# Patient Record
Sex: Female | Born: 1980 | Race: White | Hispanic: No | Smoking: Current every day smoker
Health system: Southern US, Community
[De-identification: ages and names within clinical notes are randomized; demographics above are authoritative.]

## PROBLEM LIST (undated history)

## (undated) DIAGNOSIS — L409 Psoriasis, unspecified: Secondary | ICD-10-CM

## (undated) DIAGNOSIS — F458 Other somatoform disorders: Secondary | ICD-10-CM

## (undated) DIAGNOSIS — M2669 Other specified disorders of temporomandibular joint: Secondary | ICD-10-CM

## (undated) DIAGNOSIS — F419 Anxiety disorder, unspecified: Secondary | ICD-10-CM

## (undated) DIAGNOSIS — R203 Hyperesthesia: Secondary | ICD-10-CM

## (undated) DIAGNOSIS — S63599A Other specified sprain of unspecified wrist, initial encounter: Secondary | ICD-10-CM

## (undated) HISTORY — PX: FOREARM FRACTURE SURGERY: SHX649

## (undated) HISTORY — PX: WISDOM TOOTH EXTRACTION: SHX21

---

## 1998-10-23 ENCOUNTER — Inpatient Hospital Stay (HOSPITAL_COMMUNITY): Admission: AD | Admit: 1998-10-23 | Discharge: 1998-10-23 | Payer: Self-pay | Admitting: *Deleted

## 1999-04-03 ENCOUNTER — Inpatient Hospital Stay (HOSPITAL_COMMUNITY): Admission: AD | Admit: 1999-04-03 | Discharge: 1999-04-03 | Payer: Self-pay | Admitting: Obstetrics & Gynecology

## 1999-06-24 ENCOUNTER — Other Ambulatory Visit: Admission: RE | Admit: 1999-06-24 | Discharge: 1999-06-24 | Payer: Self-pay | Admitting: Obstetrics

## 1999-06-29 ENCOUNTER — Inpatient Hospital Stay (HOSPITAL_COMMUNITY): Admission: AD | Admit: 1999-06-29 | Discharge: 1999-06-29 | Payer: Self-pay | Admitting: *Deleted

## 1999-06-29 ENCOUNTER — Encounter: Payer: Self-pay | Admitting: *Deleted

## 1999-07-25 ENCOUNTER — Observation Stay (HOSPITAL_COMMUNITY): Admission: AD | Admit: 1999-07-25 | Discharge: 1999-07-26 | Payer: Self-pay | Admitting: *Deleted

## 1999-07-25 ENCOUNTER — Encounter: Payer: Self-pay | Admitting: Obstetrics & Gynecology

## 1999-08-06 ENCOUNTER — Inpatient Hospital Stay (HOSPITAL_COMMUNITY): Admission: AD | Admit: 1999-08-06 | Discharge: 1999-08-06 | Payer: Self-pay | Admitting: *Deleted

## 1999-08-08 ENCOUNTER — Inpatient Hospital Stay (HOSPITAL_COMMUNITY): Admission: AD | Admit: 1999-08-08 | Discharge: 1999-08-08 | Payer: Self-pay | Admitting: Obstetrics & Gynecology

## 1999-09-14 ENCOUNTER — Inpatient Hospital Stay (HOSPITAL_COMMUNITY): Admission: AD | Admit: 1999-09-14 | Discharge: 1999-09-14 | Payer: Self-pay | Admitting: Obstetrics & Gynecology

## 1999-09-27 ENCOUNTER — Inpatient Hospital Stay (HOSPITAL_COMMUNITY): Admission: AD | Admit: 1999-09-27 | Discharge: 1999-09-27 | Payer: Self-pay | Admitting: Obstetrics

## 1999-09-28 ENCOUNTER — Inpatient Hospital Stay (HOSPITAL_COMMUNITY): Admission: AD | Admit: 1999-09-28 | Discharge: 1999-09-28 | Payer: Self-pay | Admitting: *Deleted

## 1999-10-05 ENCOUNTER — Inpatient Hospital Stay (HOSPITAL_COMMUNITY): Admission: AD | Admit: 1999-10-05 | Discharge: 1999-10-05 | Payer: Self-pay | Admitting: *Deleted

## 1999-11-15 ENCOUNTER — Inpatient Hospital Stay (HOSPITAL_COMMUNITY): Admission: AD | Admit: 1999-11-15 | Discharge: 1999-11-17 | Payer: Self-pay | Admitting: Obstetrics

## 2000-02-07 ENCOUNTER — Encounter (INDEPENDENT_AMBULATORY_CARE_PROVIDER_SITE_OTHER): Payer: Self-pay

## 2000-02-07 ENCOUNTER — Other Ambulatory Visit: Admission: RE | Admit: 2000-02-07 | Discharge: 2000-02-07 | Payer: Self-pay | Admitting: Obstetrics and Gynecology

## 2000-02-17 ENCOUNTER — Encounter (INDEPENDENT_AMBULATORY_CARE_PROVIDER_SITE_OTHER): Payer: Self-pay

## 2000-02-17 ENCOUNTER — Other Ambulatory Visit: Admission: RE | Admit: 2000-02-17 | Discharge: 2000-02-17 | Payer: Self-pay | Admitting: Obstetrics and Gynecology

## 2000-10-23 ENCOUNTER — Inpatient Hospital Stay (HOSPITAL_COMMUNITY): Admission: AD | Admit: 2000-10-23 | Discharge: 2000-10-23 | Payer: Self-pay | Admitting: Obstetrics and Gynecology

## 2000-10-23 ENCOUNTER — Encounter: Payer: Self-pay | Admitting: Obstetrics and Gynecology

## 2000-11-19 ENCOUNTER — Ambulatory Visit (HOSPITAL_COMMUNITY): Admission: RE | Admit: 2000-11-19 | Discharge: 2000-11-19 | Payer: Self-pay | Admitting: Obstetrics and Gynecology

## 2000-11-19 ENCOUNTER — Encounter (INDEPENDENT_AMBULATORY_CARE_PROVIDER_SITE_OTHER): Payer: Self-pay

## 2000-11-19 HISTORY — PX: CYST EXCISION: SHX5701

## 2001-02-12 ENCOUNTER — Emergency Department (HOSPITAL_COMMUNITY): Admission: EM | Admit: 2001-02-12 | Discharge: 2001-02-13 | Payer: Self-pay | Admitting: Internal Medicine

## 2001-08-19 ENCOUNTER — Emergency Department (HOSPITAL_COMMUNITY): Admission: EM | Admit: 2001-08-19 | Discharge: 2001-08-20 | Payer: Self-pay | Admitting: Emergency Medicine

## 2001-08-19 ENCOUNTER — Encounter: Payer: Self-pay | Admitting: Emergency Medicine

## 2002-04-28 ENCOUNTER — Encounter: Payer: Self-pay | Admitting: Emergency Medicine

## 2002-04-28 ENCOUNTER — Inpatient Hospital Stay (HOSPITAL_COMMUNITY): Admission: EM | Admit: 2002-04-28 | Discharge: 2002-05-01 | Payer: Self-pay | Admitting: Obstetrics and Gynecology

## 2002-07-22 ENCOUNTER — Emergency Department (HOSPITAL_COMMUNITY): Admission: EM | Admit: 2002-07-22 | Discharge: 2002-07-22 | Payer: Self-pay

## 2002-07-24 ENCOUNTER — Emergency Department (HOSPITAL_COMMUNITY): Admission: EM | Admit: 2002-07-24 | Discharge: 2002-07-24 | Payer: Self-pay | Admitting: *Deleted

## 2002-07-26 ENCOUNTER — Encounter: Payer: Self-pay | Admitting: Emergency Medicine

## 2002-07-26 ENCOUNTER — Emergency Department (HOSPITAL_COMMUNITY): Admission: EM | Admit: 2002-07-26 | Discharge: 2002-07-27 | Payer: Self-pay | Admitting: Emergency Medicine

## 2002-08-05 ENCOUNTER — Encounter: Payer: Self-pay | Admitting: Emergency Medicine

## 2002-08-05 ENCOUNTER — Emergency Department (HOSPITAL_COMMUNITY): Admission: EM | Admit: 2002-08-05 | Discharge: 2002-08-05 | Payer: Self-pay | Admitting: Emergency Medicine

## 2002-08-31 ENCOUNTER — Emergency Department (HOSPITAL_COMMUNITY): Admission: EM | Admit: 2002-08-31 | Discharge: 2002-08-31 | Payer: Self-pay | Admitting: Emergency Medicine

## 2002-08-31 ENCOUNTER — Encounter: Payer: Self-pay | Admitting: Emergency Medicine

## 2002-09-02 ENCOUNTER — Emergency Department (HOSPITAL_COMMUNITY): Admission: EM | Admit: 2002-09-02 | Discharge: 2002-09-02 | Payer: Self-pay | Admitting: Emergency Medicine

## 2002-12-18 ENCOUNTER — Emergency Department (HOSPITAL_COMMUNITY): Admission: EM | Admit: 2002-12-18 | Discharge: 2002-12-18 | Payer: Self-pay | Admitting: Emergency Medicine

## 2002-12-21 ENCOUNTER — Emergency Department (HOSPITAL_COMMUNITY): Admission: EM | Admit: 2002-12-21 | Discharge: 2002-12-21 | Payer: Self-pay | Admitting: Emergency Medicine

## 2002-12-21 ENCOUNTER — Encounter: Payer: Self-pay | Admitting: Emergency Medicine

## 2003-01-17 ENCOUNTER — Emergency Department (HOSPITAL_COMMUNITY): Admission: EM | Admit: 2003-01-17 | Discharge: 2003-01-17 | Payer: Self-pay | Admitting: Emergency Medicine

## 2003-07-11 ENCOUNTER — Emergency Department (HOSPITAL_COMMUNITY): Admission: EM | Admit: 2003-07-11 | Discharge: 2003-07-11 | Payer: Self-pay | Admitting: *Deleted

## 2003-07-19 ENCOUNTER — Inpatient Hospital Stay (HOSPITAL_COMMUNITY): Admission: AD | Admit: 2003-07-19 | Discharge: 2003-07-19 | Payer: Self-pay | Admitting: Obstetrics and Gynecology

## 2003-08-04 ENCOUNTER — Emergency Department (HOSPITAL_COMMUNITY): Admission: EM | Admit: 2003-08-04 | Discharge: 2003-08-04 | Payer: Self-pay | Admitting: Emergency Medicine

## 2003-09-19 ENCOUNTER — Emergency Department (HOSPITAL_COMMUNITY): Admission: EM | Admit: 2003-09-19 | Discharge: 2003-09-19 | Payer: Self-pay | Admitting: Emergency Medicine

## 2004-11-05 ENCOUNTER — Ambulatory Visit: Payer: Self-pay | Admitting: Obstetrics and Gynecology

## 2004-11-06 ENCOUNTER — Ambulatory Visit (HOSPITAL_COMMUNITY): Admission: RE | Admit: 2004-11-06 | Discharge: 2004-11-06 | Payer: Self-pay | Admitting: *Deleted

## 2004-11-14 ENCOUNTER — Ambulatory Visit (HOSPITAL_COMMUNITY): Admission: RE | Admit: 2004-11-14 | Discharge: 2004-11-14 | Payer: Self-pay | Admitting: *Deleted

## 2005-01-07 ENCOUNTER — Inpatient Hospital Stay (HOSPITAL_COMMUNITY): Admission: AD | Admit: 2005-01-07 | Discharge: 2005-01-07 | Payer: Self-pay | Admitting: *Deleted

## 2005-03-21 ENCOUNTER — Emergency Department (HOSPITAL_COMMUNITY): Admission: EM | Admit: 2005-03-21 | Discharge: 2005-03-21 | Payer: Self-pay | Admitting: Emergency Medicine

## 2006-02-27 ENCOUNTER — Emergency Department (HOSPITAL_COMMUNITY): Admission: EM | Admit: 2006-02-27 | Discharge: 2006-02-28 | Payer: Self-pay | Admitting: Emergency Medicine

## 2006-04-28 ENCOUNTER — Emergency Department (HOSPITAL_COMMUNITY): Admission: EM | Admit: 2006-04-28 | Discharge: 2006-04-28 | Payer: Self-pay | Admitting: *Deleted

## 2006-11-14 ENCOUNTER — Emergency Department (HOSPITAL_COMMUNITY): Admission: EM | Admit: 2006-11-14 | Discharge: 2006-11-15 | Payer: Self-pay | Admitting: Emergency Medicine

## 2006-12-18 ENCOUNTER — Emergency Department (HOSPITAL_COMMUNITY): Admission: EM | Admit: 2006-12-18 | Discharge: 2006-12-18 | Payer: Self-pay | Admitting: Emergency Medicine

## 2007-01-05 ENCOUNTER — Emergency Department (HOSPITAL_COMMUNITY): Admission: EM | Admit: 2007-01-05 | Discharge: 2007-01-05 | Payer: Self-pay | Admitting: Emergency Medicine

## 2007-08-30 ENCOUNTER — Emergency Department (HOSPITAL_COMMUNITY): Admission: EM | Admit: 2007-08-30 | Discharge: 2007-08-30 | Payer: Self-pay | Admitting: Emergency Medicine

## 2007-09-06 ENCOUNTER — Emergency Department (HOSPITAL_COMMUNITY): Admission: EM | Admit: 2007-09-06 | Discharge: 2007-09-06 | Payer: Self-pay | Admitting: Emergency Medicine

## 2007-09-28 ENCOUNTER — Emergency Department (HOSPITAL_COMMUNITY): Admission: EM | Admit: 2007-09-28 | Discharge: 2007-09-28 | Payer: Self-pay | Admitting: Emergency Medicine

## 2007-10-23 ENCOUNTER — Emergency Department (HOSPITAL_COMMUNITY): Admission: EM | Admit: 2007-10-23 | Discharge: 2007-10-23 | Payer: Self-pay | Admitting: Emergency Medicine

## 2008-02-14 ENCOUNTER — Emergency Department (HOSPITAL_COMMUNITY): Admission: EM | Admit: 2008-02-14 | Discharge: 2008-02-14 | Payer: Self-pay | Admitting: Emergency Medicine

## 2008-06-07 ENCOUNTER — Emergency Department (HOSPITAL_COMMUNITY): Admission: EM | Admit: 2008-06-07 | Discharge: 2008-06-07 | Payer: Self-pay | Admitting: Emergency Medicine

## 2008-08-27 ENCOUNTER — Emergency Department (HOSPITAL_COMMUNITY): Admission: EM | Admit: 2008-08-27 | Discharge: 2008-08-27 | Payer: Self-pay | Admitting: Emergency Medicine

## 2008-12-17 ENCOUNTER — Emergency Department (HOSPITAL_COMMUNITY): Admission: EM | Admit: 2008-12-17 | Discharge: 2008-12-17 | Payer: Self-pay | Admitting: Emergency Medicine

## 2009-02-03 ENCOUNTER — Emergency Department (HOSPITAL_COMMUNITY): Admission: EM | Admit: 2009-02-03 | Discharge: 2009-02-03 | Payer: Self-pay | Admitting: Emergency Medicine

## 2009-07-31 ENCOUNTER — Emergency Department (HOSPITAL_COMMUNITY): Admission: EM | Admit: 2009-07-31 | Discharge: 2009-08-01 | Payer: Self-pay | Admitting: Emergency Medicine

## 2010-04-11 ENCOUNTER — Emergency Department (HOSPITAL_COMMUNITY): Admission: EM | Admit: 2010-04-11 | Discharge: 2010-04-11 | Payer: Self-pay | Admitting: Emergency Medicine

## 2010-04-12 ENCOUNTER — Emergency Department (HOSPITAL_COMMUNITY): Admission: EM | Admit: 2010-04-12 | Discharge: 2010-04-12 | Payer: Self-pay | Admitting: Emergency Medicine

## 2010-11-12 LAB — RAPID STREP SCREEN (MED CTR MEBANE ONLY): Streptococcus, Group A Screen (Direct): NEGATIVE

## 2010-12-20 NOTE — Group Therapy Note (Signed)
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  During her incarceration they discovered an ovarian cyst measuring about 10  cm in diameter. Her last ultrasound was done in December and because she was  getting out soon they held off on operation. She is still having pain in  that area but mainly radiating to the lower back. She has always had normal  Pap smear. She is on no medication on a regular basis. In addition she has  been waking up for the last couple of months with nausea, vomiting and  fever. She was seen at [facility] with a fever of 101 at one time while she  was still incarcerated (in the last month). We are going to get a CA-125,  Helicobacter pylori titer, and ultrasound of the ovary and gallbladder.   PLAN:  My plan is to remove that ovary or the cyst. We have discussed the  possibility that she could even end up with a hysterectomy if we find enough  disease within the abdomen.   PHYSICAL EXAMINATION:  ABDOMEN:  Was soft, flat, nontender, no masses, no  organomegaly, but on deep palpation in the right lower quadrant it  duplicates the back pain she is feeling and she says it feels like it is  pushing down on her cervix.  GENITALIA:  Is normal. BUS within normal limits. Vagina is clean and well  rugated.  PELVIC:  The cervix is clean and parous. Uterus is anterior, normal size,  shape and consistency.  The left ovary seems to be within normal limits. The  right ovary as aforementioned.   IMPRESSION:  1.  Complex right ovarian cyst.  2.  Recurrent nocturnal  nausea and vomiting with no signs of regurgitation.   I called the patient and scheduled surgery 1 week after the laboratory  reports and ultrasound are back.      PR/MEDQ  D:  11/05/2004  T:  11/06/2004  Job:  [id number]

## 2010-12-20 NOTE — Op Note (Signed)
[facility]  Patient:    Lindsey Martin, Lindsey Martin                    MRN: [id number] Adm. Date:  [id number] Attending:  Wandalee Ferdinand                           Operative Report  PREOPERATIVE DIAGNOSIS:       Vulvar inclusion cyst.  POSTOPERATIVE DIAGNOSIS:      Vulvar inclusion cyst, pathology pending.  PROCEDURE:                    Excision of vulvar inclusion cyst - right labium                               majus.  SURGEON:                      Rudy Jew. Ashley Royalty, M.D.  ANESTHESIA:                   IV sedation with 1% Xylocaine local followed by                               0.25% Marcaine local.  ESTIMATED BLOOD LOSS:         Less than 25 cc.  COMPLICATIONS:                None.  PACKS AND DRAINS:             None.  DESCRIPTION OF PROCEDURE:     The patient was taken to the operating room and placed in the dorsal supine position.  After adequate IV sedation was administered, she was prepped and draped in the usual manner for vulvar surgery.  The vulvar cyst was identified on the anterior aspect of the right labium majus and slightly laterally.  Approximately 5 cc of 1% Xylocaine was instilled inferior and deep to the cyst.  Using a 15 blade, the skin was incised over the cyst and the tissue surrounding the cyst were sharply and bluntly dissected in order to fully expose the cyst which was enucleated intact and submitted to pathology for histologic studies.  The dead space was closed with 3-0 chromic in an interrupted fashion.  The skin was closed with a 3-0 chromic in a subcuticular fashion.  Hemostasis was noted and the procedure terminated.  The patient tolerated the procedure extremely well and was returned to the recovery room in good condition. DD:  11/19/00 TD:  11/20/00 Job: [id number]

## 2010-12-20 NOTE — H&P (Signed)
[facility]  Patient:    Lindsey Martin, Lindsey Martin                    MRN: [id number] Adm. Date:  [id number] Attending:  Wandalee Ferdinand                         History and Physical  HISTORY OF PRESENT ILLNESS:   This is a 30 year old gravida 1, para 1 who presented complaining of a right-sided mass on the anterior aspect of her right labium majus.  It is symptomatic and causes her discomfort and she would like it removed.  MEDICATIONS:                  1. Doxycycline.                               2. Metronidazole.  PAST MEDICAL HISTORY:         Negative.  PAST SURGICAL HISTORY:        1997 LEEP vaginal delivery.  ALLERGIES:                    ZITHROMAX.  FAMILY HISTORY:               Positive for hypertension and diabetes.  SOCIAL HISTORY:               The patient drinks moderate alcohol and smokes cigarettes.  REVIEW OF SYSTEMS:            Noncontributory.  PHYSICAL EXAMINATION:  GENERAL:                      Well-developed, well-nourished, pleasant white female in no acute distress.  VITAL SIGNS:                  Afebrile.  SKIN:                         Warm and dry without lesions.  LYMPH NODES:                  There is no supraclavicular, cervical, or inguinal adenopathy.  HEENT:                        Normocephalic.  NECK:                         Supple without thyromegaly.  CHEST:                        Lungs are clear.  CARDIAC:                      Regular rate and rhythm without murmurs, gallops, or rubs.  BREASTS:                      Deferred.  ABDOMEN:                      Soft and nontender without masses or organomegaly.  Bowel sounds are active.  MUSCULOSKELETAL:              Full range of motion without edema, cyanosis, or CVA tenderness.  PELVIC:  On examination on the right aspect of the labium majus, there is an apparent inclusion cyst of approximately 0.5 cm in diameter.  It was  easily mobile.  IMPRESSION:                   Vulvar inclusion cyst--symptomatic.  PLAN:                         Removal of inclusion cyst.  Risks, benefits, ______ and alternatives were fully discussed with the patient.  She states she understands and accepts.  Questions invited and answered. DD:  11/19/00 TD:  11/19/00 Job: [id number]

## 2010-12-20 NOTE — Discharge Summary (Signed)
NAME:  Lindsey Martin, Lindsey Martin                       ACCOUNT NO.:  [id number]   MEDICAL RECORD NO.:  [id number]                   PATIENT TYPE:  INP   LOCATION:  9310                                 FACILITY:  [facility]   PHYSICIAN:  Janine Limbo, M.D.            DATE OF BIRTH:  14-Sep-1980   DATE OF ADMISSION:  04/28/2002  DATE OF DISCHARGE:  05/01/2002                                 DISCHARGE SUMMARY   DISCHARGE DIAGNOSES:  1. Pelvic inflammatory disease.  2. Cervical gonorrhea.  3. Abdominal pain.   PROCEDURES THIS ADMISSION:  None.   HISTORY OF PRESENT ILLNESS:  The patient is a 30 year old female gravida 1,  para 1-0-0-1 who presented to the [facility]  on April 28, 2002 complaining of abdominal pain.  She had nausea,  vomiting, and diarrhea.  The patient was evaluated and thought to have  pelvic inflammatory disease.  Her last menstrual period was April 20, 2002.  The patient has a history of loop electrosurgical excision procedure  because of abnormal Pap smears.  She has had one vaginal delivery at term.   ADMISSION PHYSICAL EXAMINATION:  VITAL SIGNS:  Temperature was 101.9.  GENERAL:  Her general exam was within normal limits.  PELVIC:  Significant for tender cervix and uterus.  No adnexal masses were  appreciated.   ADMISSION LABORATORY DATA:  Pregnancy test was negative.  White blood cell  count was 18,400 with 88% neutrophils.  Hemoglobin was 15.3.  Urinalysis was  negative.   ADMISSION IMAGING STUDIES:  An ultrasound was performed that showed no  pelvic masses or abnormal fluid collections.   HOSPITAL COURSE:  The patient was admitted to [facility]  for IV antibiotics.  She was given cefotetan and doxycycline.  Her  temperature slowly returned to normal.  A repeat white blood cell count on  April 29, 2002 was 17,700 and then on April 30, 2002 was 8900.  He  Chlamydia culture returned negative but her  gonorrhea culture returned  positive.  The patient slowly began to feel better.  She tolerated a regular  diet.  By May 01, 2002, the patient was felt to be ready for a  discharge.  He pelvic exam was repeated and the tenderness has completely  resolved.   DISCHARGE INSTRUCTIONS:  The patient will be given doxycycline 100 mg to  take twice each day for an additional 11 days.  She will also take Septra DS  one tablet twice each day for an additional 4 days.  She was given a  prescription for Vicodin 1-2 tablets every 4 hours as needed for pain  (dispense #30 tablets) (#1 refill).  She will measure her temperature twice  each day and will call for a temperature greater than 100.4.  She will  return to  [facility] in 4-5 days for a repeat  examination.  An HIV screen  is pending at this point and this will be  checked and then discussed with the patient when she comes in for a followup  examination.  The patient was also told that her HIV screen needed to be  repeated in 90 days.                                               Janine Limbo, M.D.    AVS/MEDQ  D:  05/01/2002  T:  05/02/2002  Job:  [id number]

## 2010-12-20 NOTE — Consult Note (Signed)
   NAME:  Lindsey Martin, Lindsey Martin                       ACCOUNT NO.:  [id number]   MEDICAL RECORD NO.:  [id number]                   PATIENT TYPE:  EMS   LOCATION:  [facility]                                 FACILITY:  [facility]   PHYSICIAN:  Abigail Miyamoto, M.D.              DATE OF BIRTH:  1981/04/06   DATE OF CONSULTATION:  01/17/2003  DATE OF DISCHARGE:                                   CONSULTATION   CHIEF COMPLAINT:  Rectal bleeding.   HISTORY OF PRESENT ILLNESS:  This is a 30 year old patient who had  participated in anal sex two days ago.  She then admitted to crack cocaine  after this.  For the past two days, she has had a profuse amount of anal  bleeding. She is currently on her period.  She denies any dizziness.  She  has had no hypotensive episodes.  She reports the bleeding has been perfuse  and this is the first time she has ever had bleeding per her anus.  She  otherwise is healthy and without complaints.   PAST MEDICAL HISTORY:  Negative.   PAST SURGICAL HISTORY:  Negative.   MEDICATIONS:  None.   ALLERGIES:  No known drug allergies.   SOCIAL HISTORY:  She works as a Copywriter, advertising.   HABITS:  She admits to smoking and to crack cocaine use.   PHYSICAL EXAMINATION:  GENERAL APPEARANCE:  A thin female in no acute  distress.  VITAL SIGNS:  She is afebrile.  Vital signs are stable.  Examination was limited to the anal area.  On outer examination, the  examination is normal.  There is some gross blood.  Anoscopy was performed  and there was blood in the anal canal but exact defect could not be  identified.  At this point, the anal canal was packed with a large piece of  Gelfoam.   LABORATORY DATA:  Hemoglobin 13.2, hematocrit 32.4.   ASSESSMENT AND PLAN:  The patient had anal trauma after anal intercourse.  At this point, the bleeding has stopped after a piece of Gelfoam had been  placed in the anal canal for over an hour.  At this point, we will forego  planned examination under  anesthesia and follow this expectantly.  If she  does resume bleeding, we will proceed to the operating room.                                               Abigail Miyamoto, M.D.    DB/MEDQ  D:  01/17/2003  T:  01/17/2003  Job:  [id number]

## 2011-01-02 ENCOUNTER — Inpatient Hospital Stay (INDEPENDENT_AMBULATORY_CARE_PROVIDER_SITE_OTHER)
Admission: RE | Admit: 2011-01-02 | Discharge: 2011-01-02 | Disposition: A | Discharge status: Home / Self Care | Payer: Self-pay | Source: Ambulatory Visit | Attending: Family Medicine | Admitting: Family Medicine

## 2011-01-02 DIAGNOSIS — M799 Soft tissue disorder, unspecified: Secondary | ICD-10-CM

## 2011-01-02 DIAGNOSIS — M542 Cervicalgia: Secondary | ICD-10-CM

## 2011-01-24 ENCOUNTER — Emergency Department (HOSPITAL_COMMUNITY): Payer: Self-pay

## 2011-01-24 ENCOUNTER — Emergency Department (HOSPITAL_COMMUNITY)
Admission: EM | Admit: 2011-01-24 | Discharge: 2011-01-24 | Disposition: A | Discharge status: Home / Self Care | Payer: Self-pay | Attending: Emergency Medicine | Admitting: Emergency Medicine

## 2011-01-24 DIAGNOSIS — M25476 Effusion, unspecified foot: Secondary | ICD-10-CM | POA: Insufficient documentation

## 2011-01-24 DIAGNOSIS — F17200 Nicotine dependence, unspecified, uncomplicated: Secondary | ICD-10-CM | POA: Insufficient documentation

## 2011-01-24 DIAGNOSIS — M25473 Effusion, unspecified ankle: Secondary | ICD-10-CM | POA: Insufficient documentation

## 2011-01-24 DIAGNOSIS — X500XXA Overexertion from strenuous movement or load, initial encounter: Secondary | ICD-10-CM | POA: Insufficient documentation

## 2011-01-24 DIAGNOSIS — S9000XA Contusion of unspecified ankle, initial encounter: Secondary | ICD-10-CM | POA: Insufficient documentation

## 2011-01-24 DIAGNOSIS — M25673 Stiffness of unspecified ankle, not elsewhere classified: Secondary | ICD-10-CM | POA: Insufficient documentation

## 2011-01-24 DIAGNOSIS — M25676 Stiffness of unspecified foot, not elsewhere classified: Secondary | ICD-10-CM | POA: Insufficient documentation

## 2011-01-24 DIAGNOSIS — M25579 Pain in unspecified ankle and joints of unspecified foot: Secondary | ICD-10-CM | POA: Insufficient documentation

## 2011-01-24 DIAGNOSIS — S93409A Sprain of unspecified ligament of unspecified ankle, initial encounter: Secondary | ICD-10-CM | POA: Insufficient documentation

## 2011-04-28 LAB — RAPID STREP SCREEN (MED CTR MEBANE ONLY): Streptococcus, Group A Screen (Direct): NEGATIVE

## 2011-05-07 LAB — URINALYSIS, ROUTINE W REFLEX MICROSCOPIC
Bilirubin Urine: NEGATIVE
Glucose, UA: NEGATIVE
Hgb urine dipstick: NEGATIVE
Ketones, ur: NEGATIVE
Nitrite: NEGATIVE
Protein, ur: NEGATIVE
Specific Gravity, Urine: 1.029
Urobilinogen, UA: 0.2
pH: 7

## 2011-05-07 LAB — PREGNANCY, URINE: Preg Test, Ur: NEGATIVE

## 2011-12-12 ENCOUNTER — Emergency Department (INDEPENDENT_AMBULATORY_CARE_PROVIDER_SITE_OTHER)
Admission: EM | Admit: 2011-12-12 | Discharge: 2011-12-12 | Disposition: A | Discharge status: Other Acute Inpatient Hospital | Payer: Self-pay | Source: Home / Self Care | Attending: Emergency Medicine | Admitting: Emergency Medicine

## 2011-12-12 ENCOUNTER — Emergency Department (HOSPITAL_COMMUNITY)
Admission: EM | Admit: 2011-12-12 | Discharge: 2011-12-12 | Disposition: A | Discharge status: Home / Self Care | Payer: Self-pay | Attending: Emergency Medicine | Admitting: Emergency Medicine

## 2011-12-12 ENCOUNTER — Encounter (HOSPITAL_COMMUNITY): Payer: Self-pay | Admitting: Emergency Medicine

## 2011-12-12 ENCOUNTER — Emergency Department (HOSPITAL_COMMUNITY): Payer: Self-pay

## 2011-12-12 ENCOUNTER — Encounter (HOSPITAL_COMMUNITY): Payer: Self-pay

## 2011-12-12 DIAGNOSIS — A499 Bacterial infection, unspecified: Secondary | ICD-10-CM | POA: Insufficient documentation

## 2011-12-12 DIAGNOSIS — R109 Unspecified abdominal pain: Secondary | ICD-10-CM

## 2011-12-12 DIAGNOSIS — N760 Acute vaginitis: Secondary | ICD-10-CM | POA: Insufficient documentation

## 2011-12-12 DIAGNOSIS — N898 Other specified noninflammatory disorders of vagina: Secondary | ICD-10-CM | POA: Insufficient documentation

## 2011-12-12 DIAGNOSIS — B9689 Other specified bacterial agents as the cause of diseases classified elsewhere: Secondary | ICD-10-CM | POA: Insufficient documentation

## 2011-12-12 DIAGNOSIS — R197 Diarrhea, unspecified: Secondary | ICD-10-CM | POA: Insufficient documentation

## 2011-12-12 LAB — URINALYSIS, ROUTINE W REFLEX MICROSCOPIC
Bilirubin Urine: NEGATIVE
Glucose, UA: NEGATIVE mg/dL
Hgb urine dipstick: NEGATIVE
Ketones, ur: NEGATIVE mg/dL
Leukocytes, UA: NEGATIVE
Nitrite: NEGATIVE
Protein, ur: NEGATIVE mg/dL
Specific Gravity, Urine: 1.012 (ref 1.005–1.030)
Urobilinogen, UA: 0.2 mg/dL (ref 0.0–1.0)
pH: 5.5 (ref 5.0–8.0)

## 2011-12-12 LAB — CBC
HCT: 44 % (ref 36.0–46.0)
Hemoglobin: 15 g/dL (ref 12.0–15.0)
MCH: 31.6 pg (ref 26.0–34.0)
MCHC: 34.1 g/dL (ref 30.0–36.0)
MCV: 92.6 fL (ref 78.0–100.0)
Platelets: 207 10*3/uL (ref 150–400)
RBC: 4.75 MIL/uL (ref 3.87–5.11)
RDW: 12.8 % (ref 11.5–15.5)
WBC: 9.6 10*3/uL (ref 4.0–10.5)

## 2011-12-12 LAB — DIFFERENTIAL
Basophils Absolute: 0 10*3/uL (ref 0.0–0.1)
Basophils Relative: 0 % (ref 0–1)
Eosinophils Absolute: 0.1 10*3/uL (ref 0.0–0.7)
Eosinophils Relative: 1 % (ref 0–5)
Lymphocytes Relative: 37 % (ref 12–46)
Lymphs Abs: 3.6 10*3/uL (ref 0.7–4.0)
Monocytes Absolute: 0.8 10*3/uL (ref 0.1–1.0)
Monocytes Relative: 8 % (ref 3–12)
Neutro Abs: 5.2 10*3/uL (ref 1.7–7.7)
Neutrophils Relative %: 54 % (ref 43–77)

## 2011-12-12 LAB — WET PREP, GENITAL
Trich, Wet Prep: NONE SEEN
Yeast Wet Prep HPF POC: NONE SEEN

## 2011-12-12 LAB — COMPREHENSIVE METABOLIC PANEL
ALT: 14 U/L (ref 0–35)
AST: 22 U/L (ref 0–37)
Albumin: 4.3 g/dL (ref 3.5–5.2)
Alkaline Phosphatase: 60 U/L (ref 39–117)
BUN: 8 mg/dL (ref 6–23)
CO2: 24 mEq/L (ref 19–32)
Calcium: 9.7 mg/dL (ref 8.4–10.5)
Chloride: 104 mEq/L (ref 96–112)
Creatinine, Ser: 0.69 mg/dL (ref 0.50–1.10)
GFR calc Af Amer: >90 mL/min (ref >90)
GFR calc non Af Amer: >90 mL/min (ref >90)
Glucose, Bld: 85 mg/dL (ref 70–99)
Potassium: 3.9 mEq/L (ref 3.5–5.1)
Sodium: 139 mEq/L (ref 135–145)
Total Bilirubin: 0.3 mg/dL (ref 0.3–1.2)
Total Protein: 7.5 g/dL (ref 6.0–8.3)

## 2011-12-12 LAB — LIPASE, BLOOD: Lipase: 95 U/L — ABNORMAL HIGH (ref 11–59)

## 2011-12-12 LAB — PREGNANCY, URINE: Preg Test, Ur: NEGATIVE

## 2011-12-12 LAB — POCT PREGNANCY, URINE: Preg Test, Ur: NEGATIVE

## 2011-12-12 MED ORDER — MORPHINE SULFATE 2 MG/ML IJ SOLN
2.0000 mg | INTRAMUSCULAR | Status: DC | PRN
  Administered 2011-12-12: 2 mg via INTRAVENOUS
  Filled 2011-12-12: qty 1

## 2011-12-12 MED ORDER — METRONIDAZOLE 500 MG PO TABS: 500.0000 mg | ORAL_TABLET | Freq: Two times a day (BID) | ORAL | Status: AC

## 2011-12-12 MED ORDER — ONDANSETRON HCL 4 MG/2ML IJ SOLN: 4.0000 mg | INTRAMUSCULAR | Status: DC | PRN

## 2011-12-12 MED ORDER — FAMOTIDINE 20 MG PO TABS: 20.0000 mg | ORAL_TABLET | Freq: Two times a day (BID) | ORAL | Status: DC

## 2011-12-12 MED ORDER — FAMOTIDINE IN NACL 20-0.9 MG/50ML-% IV SOLN
20.0000 mg | Freq: Once | INTRAVENOUS | Status: AC
  Administered 2011-12-12: 20 mg via INTRAVENOUS
  Filled 2011-12-12: qty 50

## 2011-12-12 MED ORDER — SODIUM CHLORIDE 0.9 % IV SOLN
INTRAVENOUS | Status: DC
  Administered 2011-12-12: 15:00:00 via INTRAVENOUS

## 2011-12-12 MED ORDER — IOHEXOL 300 MG/ML  SOLN
80.0000 mL | Freq: Once | INTRAMUSCULAR | Status: AC | PRN
  Administered 2011-12-12: 80 mL via INTRAVENOUS

## 2011-12-12 NOTE — ED Notes (Addendum)
C/o 2 week duration of upper abdominal pain and 2 week duration of vaginal bleeding; concern for poss ulcer, drink a lot of pepto bismol and mylanta; sexually active w no BC. States she has panic attacks at times, concerned about CA of stomach and maybe ovarian CA , since she smokes

## 2011-12-12 NOTE — ED Provider Notes (Signed)
History     CSN: [id number]  Arrival date & time 12/12/11  1231   First MD Initiated Contact with Patient 12/12/11 1311      Chief Complaint  Patient presents with  . Abdominal Pain    HPI Pt was seen at 1400.  Per pt, c/o gradual onset and persistence of constant upper abd "pain" for the past 1 to 2 years.  Pt describes the pain as being present every day, worse in the mornings.  Has been associated with diarrhea.  Has been taking Mylanta and pepto bismol without relief.  Also c/o gradual onset and persistence of constant vaginal bleeding x2 weeks.  Has been eval at [facility], sent to the ED for further eval.  Denies vaginal discharge, no CP/SOB, no back/flank pain, no N/V, no black or blood in stools or emesis, no fevers, no dysuria/hematuria.    History reviewed. No pertinent past medical history.  History reviewed. No pertinent past surgical history.   History  Substance Use Topics  . Smoking status: Current Everyday Smoker  . Smokeless tobacco: Not on file  . Alcohol Use: No    Review of Systems ROS: Statement: All systems negative except as marked or noted in the HPI; Constitutional: Negative for fever and chills. ; ; Eyes: Negative for eye pain, redness and discharge. ; ; ENMT: Negative for ear pain, hoarseness, nasal congestion, sinus pressure and sore throat. ; ; Cardiovascular: Negative for chest pain, palpitations, diaphoresis, dyspnea and peripheral edema. ; ; Respiratory: Negative for cough, wheezing and stridor. ; ; Gastrointestinal: +abd pain, diarrhea. Negative for nausea, vomiting, blood in stool, hematemesis, jaundice and rectal bleeding. . ; ; Genitourinary: Negative for dysuria, flank pain and hematuria. ;  GYN:  +vaginal bleeding, no vaginal discharge, no vulvar pain. ; ; Musculoskeletal: Negative for back pain and neck pain. Negative for swelling and trauma.; ; Skin: Negative for pruritus, rash, abrasions, blisters, bruising and skin lesion.; ; Neuro: Negative for  headache, lightheadedness and neck stiffness. Negative for weakness, altered level of consciousness , altered mental status, extremity weakness, paresthesias, involuntary movement, seizure and syncope.     Allergies  Zithromax  Home Medications   Current Outpatient Rx  Name Route Sig Dispense Refill  . MIDOL PO Oral Take 2 tablets by mouth daily as needed. As needed for menstrual cramps.      BP 114/60  Pulse 58  Temp(Src) 98.5 F (36.9 C) (Oral)  Resp 18  SpO2 100%  LMP 11/28/2011  Physical Exam 1405: Physical examination:  Nursing notes reviewed; Vital signs and O2 SAT reviewed;  Constitutional: Well developed, Well nourished, Well hydrated, In no acute distress; Head:  Normocephalic, atraumatic; Eyes: EOMI, PERRL, No scleral icterus; ENMT: Mouth and pharynx normal, Mucous membranes moist; Neck: Supple, Full range of motion, No lymphadenopathy; Cardiovascular: Regular rate and rhythm, No murmur, rub, or gallop; Respiratory: Breath sounds clear & equal bilaterally, No rales, rhonchi, wheezes, or rub, Normal respiratory effort/excursion; Chest: Nontender, Movement normal; Abdomen: Soft, +TTP RLQ, RUQ, mid-epigastric areas, no rebound, Nondistended, Normal bowel sounds; Genitourinary: No CVA tenderness; Extremities: Pulses normal, No tenderness, No edema, No calf edema or asymmetry.; Neuro: AA&Ox3, Major CN grossly intact.  No gross focal motor or sensory deficits in extremities.; Skin: Color normal, Warm, Dry   ED Course  Procedures   1410:  Pt was seen at [facility], sent to the ED for further eval.  Pt already had pelvic exam performed, with wet prep and GC/chlam are pending; pt is  deferring a repeat exam.  Labs, UA/preg, CT A/P ordered.  Will move to CDU.  Report to PA VanWingen.   MDM  MDM Reviewed: previous chart, nursing note and vitals Reviewed previous: labs Interpretation: labs and CT scan     Results for orders placed during the hospital encounter of 12/12/11  WET  PREP, GENITAL      Component Value Range   Yeast Wet Prep HPF POC NONE SEEN  NONE SEEN    Trich, Wet Prep NONE SEEN  NONE SEEN    Clue Cells Wet Prep HPF POC MODERATE (*) NONE SEEN    WBC, Wet Prep HPF POC FEW (*) NONE SEEN   CBC      Component Value Range   WBC 9.6  4.0 - 10.5 (K/uL)   RBC 4.75  3.87 - 5.11 (MIL/uL)   Hemoglobin 15.0  12.0 - 15.0 (g/dL)   HCT 40.3  47.4 - 25.9 (%)   MCV 92.6  78.0 - 100.0 (fL)   MCH 31.6  26.0 - 34.0 (pg)   MCHC 34.1  30.0 - 36.0 (g/dL)   RDW 56.3  87.5 - 64.3 (%)   Platelets 207  150 - 400 (K/uL)  DIFFERENTIAL      Component Value Range   Neutrophils Relative 54  43 - 77 (%)   Neutro Abs 5.2  1.7 - 7.7 (K/uL)   Lymphocytes Relative 37  12 - 46 (%)   Lymphs Abs 3.6  0.7 - 4.0 (K/uL)   Monocytes Relative 8  3 - 12 (%)   Monocytes Absolute 0.8  0.1 - 1.0 (K/uL)   Eosinophils Relative 1  0 - 5 (%)   Eosinophils Absolute 0.1  0.0 - 0.7 (K/uL)   Basophils Relative 0  0 - 1 (%)   Basophils Absolute 0.0  0.0 - 0.1 (K/uL)  COMPREHENSIVE METABOLIC PANEL      Component Value Range   Sodium 139  135 - 145 (mEq/L)   Potassium 3.9  3.5 - 5.1 (mEq/L)   Chloride 104  96 - 112 (mEq/L)   CO2 24  19 - 32 (mEq/L)   Glucose, Bld 85  70 - 99 (mg/dL)   BUN 8  6 - 23 (mg/dL)   Creatinine, Ser 3.29  0.50 - 1.10 (mg/dL)   Calcium 9.7  8.4 - 51.8 (mg/dL)   Total Protein 7.5  6.0 - 8.3 (g/dL)   Albumin 4.3  3.5 - 5.2 (g/dL)   AST 22  0 - 37 (U/L)   ALT 14  0 - 35 (U/L)   Alkaline Phosphatase 60  39 - 117 (U/L)   Total Bilirubin 0.3  0.3 - 1.2 (mg/dL)   GFR calc non Af Amer >90  >90 (mL/min)   GFR calc Af Amer >90  >90 (mL/min)  LIPASE, BLOOD      Component Value Range   Lipase 95 (*) 11 - 59 (U/L)  PREGNANCY, URINE      Component Value Range   Preg Test, Ur NEGATIVE  NEGATIVE   URINALYSIS, ROUTINE W REFLEX MICROSCOPIC      Component Value Range   Color, Urine YELLOW  YELLOW    APPearance CLEAR  CLEAR    Specific Gravity, Urine 1.012  1.005 - 1.030     pH 5.5  5.0 - 8.0    Glucose, UA NEGATIVE  NEGATIVE (mg/dL)   Hgb urine dipstick NEGATIVE  NEGATIVE    Bilirubin Urine NEGATIVE  NEGATIVE    Ketones, ur  NEGATIVE  NEGATIVE (mg/dL)   Protein, ur NEGATIVE  NEGATIVE (mg/dL)   Urobilinogen, UA 0.2  0.0 - 1.0 (mg/dL)   Nitrite NEGATIVE  NEGATIVE    Leukocytes, UA NEGATIVE  NEGATIVE             Laray Anger, DO 12/13/11 2132

## 2011-12-12 NOTE — Discharge Instructions (Signed)
Your studies did not show any worrisome problems. It is possible that this represents an ulcer. We will start you on a daily antacid medication. Plan to call GI if you are not improving over the next several weeks.  The tests did show a bacterial vaginosis which we will treat, although this is not likely to be the cause of your symptoms. Please take the Flagyl until gone - do not drink alcohol while taking this.  Return to the ED with concerns over worsening condition.  RESOURCE GUIDE  Dental Problems  Patients with Medicaid: [facility] [phone] [address]                                           [phone] [address] Phone:  [phone]                                                  Phone:  [phone]  If unable to pay or uninsured, contact:  [facility] or [facility]. to become qualified for the adult dental clinic.  Chronic Pain Problems Contact [facility]  [phone] Patients need to be referred by their primary care doctor.  Insufficient Money for Medicine Contact [employer]:  call "211" or [facility] [phone].  No Primary Care Doctor Call [facility]  [phone] Other agencies that provide inexpensive medical care    [facility]  [phone]    [facility]  [phone]    [facility]  [phone]    [facility]  [phone]    [facility]  [phone]    [facility]  [phone]  Psychological Services [facility] Health  [phone] [facility]  [phone] [facility] Mental [facility]   [phone] (emergency services ([phone])  Substance Abuse Resources [facility]  316-318-7285 [facility] 434-513-7164 [facility] (320)009-3496 Floydene Flock (319)512-7645 Residential & Outpatient Substance Abuse Program  613-364-8714  Abuse/Neglect [facility] Child Abuse Hotline 214-118-2585 [facility] Child Abuse Hotline 734-008-4724 (After Hours)  Emergency Shelter [facility] 9715145760  M[facility] [facility] (223)[phone] [facility] (989)[phone]  MRSA Hotline #:   229-483-5327    [facility] Resources  [facility]     [employer]                          [facility] Dept. [address] [place]                       [address]      [address]  Patrecia Pace  Michell Heinrich Phone:  [phone]                                   Phone:  [phone]                 Phone:  [phone]  [facility] Phone:  [phone]  [facility] Child Abuse Hotline 2602632385 662-633-2107 (After Hours)  Abdominal Pain Abdominal pain can be caused by many things. Your caregiver decides the seriousness of your pain by an examination and possibly blood tests and X-rays. Many cases can be observed and treated at home. Most abdominal pain is not caused by a disease and will probably improve without treatment. However, in many cases, more time must pass before a clear cause of the pain can be found. Before that point, it may not be known if you need more testing, or if hospitalization or surgery is needed. HOME CARE INSTRUCTIONS   Do not take laxatives unless directed by your caregiver.   Take pain medicine only as directed by your caregiver.   Only take over-the-counter or prescription medicines for pain, discomfort, or fever as directed by your caregiver.   Try a clear liquid diet (broth, tea, or water) for as long as directed by your caregiver. Slowly move to a bland diet as tolerated.  SEEK IMMEDIATE MEDICAL CARE IF:   The pain does not go away.   You have a fever.   You keep throwing up (vomiting).   The pain is felt only in portions of the abdomen.  Pain in the right side could possibly be appendicitis. In an adult, pain in the left lower portion of the abdomen could be colitis or diverticulitis.   You pass bloody or black tarry stools.  MAKE SURE YOU:   Understand these instructions.   Will watch your condition.   Will get help right away if you are not doing well or get worse.  Document Released: 04/30/2005 Document Revised: 07/10/2011 Document Reviewed: 03/08/2008 [facility] Patient Information 2012 [place], [place].Abdominal Pain Abdominal pain can be caused by many things. Your caregiver decides the seriousness of your pain by an examination and possibly blood tests and X-rays. Many cases can be observed and treated at home. Most abdominal pain is not caused by a disease and will probably improve without treatment. However, in many cases, more time must pass before a clear cause of the pain can be found. Before that point, it may not be known if you need more testing, or if hospitalization or surgery is needed. HOME CARE INSTRUCTIONS   Do not take laxatives unless directed by your caregiver.   Take pain medicine only as directed by your caregiver.   Only take over-the-counter or prescription medicines for pain, discomfort, or fever as directed by your caregiver.   Try a clear liquid diet (broth, tea, or water) for as long as directed by your caregiver. Slowly move to a bland diet as tolerated.  SEEK IMMEDIATE MEDICAL CARE IF:   The pain does not go away.   You have a fever.   You keep throwing up (vomiting).   The pain is felt only in portions of the abdomen. Pain in the right side could possibly be appendicitis. In an adult, pain in the left lower portion of the abdomen could be colitis or diverticulitis.   You pass bloody or black tarry stools.  MAKE SURE YOU:  Understand these instructions.   Will watch your condition.   Will get help right away if you are not doing well or get worse.  Document Released: 04/30/2005  Document Revised: 07/10/2011 Document Reviewed: 03/08/2008 [facility] Patient Information 2012 [place], [place].

## 2011-12-12 NOTE — ED Notes (Signed)
Pt sent from [facility] for eval of upper abd pain x 2 years that became worse of last several days with nausea and diarrhea x 1 week; pt sts vaginal bleeding x several weeks and had pelvic exam at [facility]

## 2011-12-12 NOTE — ED Provider Notes (Signed)
3:00 PM Care assumed of pt in CDU. Pt awaiting w/u for upper abd pain x 2 years which has worsened over past several weeks. Awaiting CT abd pel; w/u so far negative.  4:00 PM Pt's CT abd/pel negative for acute findings. Given that pain located in upper abd, and sx sound somewhat c/w GERD/ulcer, will start on Pepcid daily. Instructed to f/u with GI if not improving in next weeks with this. Mod clue cells on wet prep - will tx for BV as well. Findings and plan d/w pt. She verbalized understanding and agreed to plan. Return precautions discussed.  Results for orders placed during the hospital encounter of 12/12/11  WET PREP, GENITAL      Component Value Range   Yeast Wet Prep HPF POC NONE SEEN  NONE SEEN    Trich, Wet Prep NONE SEEN  NONE SEEN    Clue Cells Wet Prep HPF POC MODERATE (*) NONE SEEN    WBC, Wet Prep HPF POC FEW (*) NONE SEEN   CBC      Component Value Range   WBC 9.6  4.0 - 10.5 (K/uL)   RBC 4.75  3.87 - 5.11 (MIL/uL)   Hemoglobin 15.0  12.0 - 15.0 (g/dL)   HCT 16.1  09.6 - 04.5 (%)   MCV 92.6  78.0 - 100.0 (fL)   MCH 31.6  26.0 - 34.0 (pg)   MCHC 34.1  30.0 - 36.0 (g/dL)   RDW 40.9  81.1 - 91.4 (%)   Platelets 207  150 - 400 (K/uL)  DIFFERENTIAL      Component Value Range   Neutrophils Relative 54  43 - 77 (%)   Neutro Abs 5.2  1.7 - 7.7 (K/uL)   Lymphocytes Relative 37  12 - 46 (%)   Lymphs Abs 3.6  0.7 - 4.0 (K/uL)   Monocytes Relative 8  3 - 12 (%)   Monocytes Absolute 0.8  0.1 - 1.0 (K/uL)   Eosinophils Relative 1  0 - 5 (%)   Eosinophils Absolute 0.1  0.0 - 0.7 (K/uL)   Basophils Relative 0  0 - 1 (%)   Basophils Absolute 0.0  0.0 - 0.1 (K/uL)  COMPREHENSIVE METABOLIC PANEL      Component Value Range   Sodium 139  135 - 145 (mEq/L)   Potassium 3.9  3.5 - 5.1 (mEq/L)   Chloride 104  96 - 112 (mEq/L)   CO2 24  19 - 32 (mEq/L)   Glucose, Bld 85  70 - 99 (mg/dL)   BUN 8  6 - 23 (mg/dL)   Creatinine, Ser 7.82  0.50 - 1.10 (mg/dL)   Calcium 9.7  8.4 - 95.6  (mg/dL)   Total Protein 7.5  6.0 - 8.3 (g/dL)   Albumin 4.3  3.5 - 5.2 (g/dL)   AST 22  0 - 37 (U/L)   ALT 14  0 - 35 (U/L)   Alkaline Phosphatase 60  39 - 117 (U/L)   Total Bilirubin 0.3  0.3 - 1.2 (mg/dL)   GFR calc non Af Amer >90  >90 (mL/min)   GFR calc Af Amer >90  >90 (mL/min)  LIPASE, BLOOD      Component Value Range   Lipase 95 (*) 11 - 59 (U/L)  PREGNANCY, URINE      Component Value Range   Preg Test, Ur NEGATIVE  NEGATIVE   URINALYSIS, ROUTINE W REFLEX MICROSCOPIC      Component Value Range   Color, Urine YELLOW  YELLOW    APPearance CLEAR  CLEAR    Specific Gravity, Urine 1.012  1.005 - 1.030    pH 5.5  5.0 - 8.0    Glucose, UA NEGATIVE  NEGATIVE (mg/dL)   Hgb urine dipstick NEGATIVE  NEGATIVE    Bilirubin Urine NEGATIVE  NEGATIVE    Ketones, ur NEGATIVE  NEGATIVE (mg/dL)   Protein, ur NEGATIVE  NEGATIVE (mg/dL)   Urobilinogen, UA 0.2  0.0 - 1.0 (mg/dL)   Nitrite NEGATIVE  NEGATIVE    Leukocytes, UA NEGATIVE  NEGATIVE    Ct Abdomen Pelvis W Contrast  12/12/2011  *RADIOLOGY REPORT*  Clinical Data: Abdominal pain  CT ABDOMEN AND PELVIS WITH CONTRAST  Technique:  Multidetector CT imaging of the abdomen and pelvis was performed following the standard protocol during bolus administration of intravenous contrast.  Contrast: 80mL OMNIPAQUE IOHEXOL 300 MG/ML  SOLN  Comparison: None.  Findings: Liver, gallbladder, pancreas, adrenal glands, kidneys are within normal limits. Nonspecific 1 cm hypodensity in the spleen.  Small amount of free fluid is seen layering in the pelvis which may simply be physiologic.  Bladder, uterus, and ovaries are unremarkable.  Normal appendix.  Normal terminal ileum.  Retroaortic left renal vein anatomy.  IMPRESSION: No acute intra-abdominal pathology.  Original Report Authenticated By: Donavan Burnet, M.D.      Grant Fontana, [place] 12/12/11 1616

## 2011-12-12 NOTE — ED Notes (Signed)
Report received, assumed care.  

## 2011-12-12 NOTE — ED Notes (Signed)
Patient transported to CT 

## 2011-12-12 NOTE — ED Provider Notes (Signed)
History     CSN: [id number]  Arrival date & time 12/12/11  1038   First MD Initiated Contact with Patient 12/12/11 1046      Chief Complaint  Patient presents with  . Abdominal Pain    (Consider location/radiation/quality/duration/timing/severity/associated sxs/prior treatment) HPI Comments: Patient presents urgent care today complaining of an ongoing pain on her upper abdomen she points towards her epigastric area but also her left upper quadrant. "It has been going on for months he has been told that this could be an ulcer or some else" " at times this sharp and burning". Have not seen any stools for blood or vomiting blood. Also I have been bleeding from os 2-3 weeks now with pain in my lower abdomen. (Points towards both lower quadrant).  Patient denies any fevers, urinary changes, diarrheas or unintentional weight loss.  Patient is a 31 y.o. female presenting with abdominal pain. The history is provided by the patient.  Abdominal Pain The primary symptoms of the illness include abdominal pain, nausea and vaginal bleeding. The primary symptoms of the illness do not include fever, fatigue, shortness of breath, vomiting, diarrhea, hematemesis, hematochezia, dysuria or vaginal discharge. The problem has been gradually worsening.  Additional symptoms associated with the illness include anorexia. Symptoms associated with the illness do not include chills, constipation, hematuria, frequency or back pain. Significant associated medical issues do not include diabetes, liver disease, diverticulitis or HIV.    History reviewed. No pertinent past medical history.  History reviewed. No pertinent past surgical history.  History reviewed. No pertinent family history.  History  Substance Use Topics  . Smoking status: Current Everyday Smoker  . Smokeless tobacco: Not on file  . Alcohol Use: No    OB History    Grav Para Term Preterm Abortions TAB SAB Ect Mult Living                   Review of Systems  Constitutional: Positive for activity change and appetite change. Negative for fever, chills and fatigue.  Respiratory: Negative for cough and shortness of breath.   Gastrointestinal: Positive for nausea, abdominal pain and anorexia. Negative for vomiting, diarrhea, constipation, hematochezia and hematemesis.  Genitourinary: Positive for vaginal bleeding. Negative for dysuria, frequency, hematuria, vaginal discharge and vaginal pain.  Musculoskeletal: Negative for back pain.    Allergies  Zithromax  Home Medications  No current outpatient prescriptions on file.  BP 103/71  Pulse 90  Temp(Src) 98.3 F (36.8 C) (Oral)  Resp 16  SpO2 100%  LMP 11/28/2011  Physical Exam  Nursing note and vitals reviewed. Constitutional: She appears well-developed and well-nourished.  HENT:  Head: Normocephalic.  Eyes: Conjunctivae are normal.  Neck: Neck supple.  Pulmonary/Chest: Effort normal. She has no wheezes.  Abdominal: Soft. She exhibits no mass. There is tenderness. There is no guarding.  Genitourinary:    There is bleeding around the vagina. Vaginal discharge found.  Skin: No rash noted.    ED Course  Procedures (including critical care time)   Labs Reviewed  POCT PREGNANCY, URINE  WET PREP, GENITAL  GC/CHLAMYDIA PROBE AMP, GENITAL   No results found.   1. Abdominal pain       MDM  Patient presented today to urgent care complaining of severe predominantly epigastric pain for months. And also pelvic pain with predominantly tenderness in the left lower quadrant region. An ongoing vaginal bleeding. Time was somewhat concerning to the point that we've think patient needs comprehensive abdominal evaluation with several labs  perhaps imaging. Differentials include pancreatitis, active peptic ulcer disease, colitis. Patient also had ovarian cyst or image years ago that needed followup never occurred.        Jimmie Molly, MD 12/12/11 1256

## 2011-12-12 NOTE — ED Notes (Signed)
Returned from CT scan.

## 2011-12-13 LAB — GC/CHLAMYDIA PROBE AMP, GENITAL
Chlamydia, DNA Probe: NEGATIVE
GC Probe Amp, Genital: NEGATIVE

## 2011-12-13 NOTE — ED Provider Notes (Signed)
Medical screening examination/treatment/procedure(s) were conducted as a shared visit with non-physician practitioner(s) and myself.  I personally evaluated the patient during the encounter See my previous note.   Laray Anger, DO 12/13/11 2131

## 2013-01-12 ENCOUNTER — Encounter (HOSPITAL_COMMUNITY): Payer: Self-pay | Admitting: Emergency Medicine

## 2013-01-12 ENCOUNTER — Emergency Department (INDEPENDENT_AMBULATORY_CARE_PROVIDER_SITE_OTHER): Payer: Self-pay

## 2013-01-12 ENCOUNTER — Emergency Department (INDEPENDENT_AMBULATORY_CARE_PROVIDER_SITE_OTHER)
Admission: EM | Admit: 2013-01-12 | Discharge: 2013-01-12 | Disposition: A | Discharge status: Home / Self Care | Payer: Self-pay | Source: Home / Self Care | Attending: Family Medicine | Admitting: Family Medicine

## 2013-01-12 DIAGNOSIS — S0003XA Contusion of scalp, initial encounter: Secondary | ICD-10-CM

## 2013-01-12 DIAGNOSIS — S0083XA Contusion of other part of head, initial encounter: Secondary | ICD-10-CM

## 2013-01-12 MED ORDER — IBUPROFEN 600 MG PO TABS: 600.0000 mg | ORAL_TABLET | Freq: Three times a day (TID) | ORAL | Status: DC | PRN
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
## 2013-01-13 ENCOUNTER — Telehealth (HOSPITAL_COMMUNITY): Payer: Self-pay | Admitting: *Deleted

## 2013-01-13 NOTE — ED Notes (Signed)
6/11 Dr. Tressia Danas asked me to call pt. and tell her that her facial bone xray's were normal and to f/u in ED if she gets worse in anyway as noted in her discharge instructions.  I called pt. and gave her this information. Pt. voiced understanding. Vassie Moselle 01/13/2013

## 2013-06-03 ENCOUNTER — Encounter (HOSPITAL_COMMUNITY): Payer: Self-pay | Admitting: *Deleted

## 2013-06-03 ENCOUNTER — Inpatient Hospital Stay (HOSPITAL_COMMUNITY): Payer: Self-pay

## 2013-06-03 ENCOUNTER — Inpatient Hospital Stay (HOSPITAL_COMMUNITY)
Admission: AD | Admit: 2013-06-03 | Discharge: 2013-06-03 | Disposition: A | Discharge status: Home / Self Care | Payer: Self-pay | Source: Ambulatory Visit | Attending: Obstetrics & Gynecology | Admitting: Obstetrics & Gynecology

## 2013-06-03 DIAGNOSIS — B9689 Other specified bacterial agents as the cause of diseases classified elsewhere: Secondary | ICD-10-CM | POA: Insufficient documentation

## 2013-06-03 DIAGNOSIS — A499 Bacterial infection, unspecified: Secondary | ICD-10-CM | POA: Insufficient documentation

## 2013-06-03 DIAGNOSIS — R109 Unspecified abdominal pain: Secondary | ICD-10-CM

## 2013-06-03 DIAGNOSIS — R1030 Lower abdominal pain, unspecified: Secondary | ICD-10-CM

## 2013-06-03 DIAGNOSIS — N949 Unspecified condition associated with female genital organs and menstrual cycle: Secondary | ICD-10-CM | POA: Insufficient documentation

## 2013-06-03 DIAGNOSIS — N760 Acute vaginitis: Secondary | ICD-10-CM | POA: Insufficient documentation

## 2013-06-03 LAB — CBC WITH DIFFERENTIAL/PLATELET
Basophils Absolute: 0 10*3/uL (ref 0.0–0.1)
Basophils Relative: 0 % (ref 0–1)
Eosinophils Absolute: 0.1 10*3/uL (ref 0.0–0.7)
Eosinophils Relative: 1 % (ref 0–5)
HCT: 40.4 % (ref 36.0–46.0)
Hemoglobin: 13.7 g/dL (ref 12.0–15.0)
Lymphocytes Relative: 28 % (ref 12–46)
Lymphs Abs: 3.1 10*3/uL (ref 0.7–4.0)
MCH: 31.6 pg (ref 26.0–34.0)
MCHC: 33.9 g/dL (ref 30.0–36.0)
MCV: 93.1 fL (ref 78.0–100.0)
Monocytes Absolute: 0.9 10*3/uL (ref 0.1–1.0)
Monocytes Relative: 8 % (ref 3–12)
Neutro Abs: 7 10*3/uL (ref 1.7–7.7)
Neutrophils Relative %: 63 % (ref 43–77)
Platelets: 251 10*3/uL (ref 150–400)
RBC: 4.34 MIL/uL (ref 3.87–5.11)
RDW: 13 % (ref 11.5–15.5)
WBC: 11.1 10*3/uL — ABNORMAL HIGH (ref 4.0–10.5)

## 2013-06-03 LAB — WET PREP, GENITAL
Trich, Wet Prep: NONE SEEN
Yeast Wet Prep HPF POC: NONE SEEN

## 2013-06-03 LAB — URINALYSIS, ROUTINE W REFLEX MICROSCOPIC
Bilirubin Urine: NEGATIVE
Glucose, UA: NEGATIVE mg/dL
Hgb urine dipstick: NEGATIVE
Ketones, ur: NEGATIVE mg/dL
Leukocytes, UA: NEGATIVE
Nitrite: NEGATIVE
Protein, ur: NEGATIVE mg/dL
Specific Gravity, Urine: 1.025 (ref 1.005–1.030)
Urobilinogen, UA: 0.2 mg/dL (ref 0.0–1.0)
pH: 6 (ref 5.0–8.0)

## 2013-06-03 LAB — POCT PREGNANCY, URINE: Preg Test, Ur: NEGATIVE

## 2013-06-03 MED ORDER — KETOROLAC TROMETHAMINE 60 MG/2ML IM SOLN
60.0000 mg | Freq: Once | INTRAMUSCULAR | Status: AC
  Administered 2013-06-03: 60 mg via INTRAMUSCULAR
  Filled 2013-06-03: qty 2

## 2013-06-03 MED ORDER — METRONIDAZOLE 500 MG PO TABS: 500.0000 mg | ORAL_TABLET | Freq: Two times a day (BID) | ORAL | Status: DC

## 2013-06-03 NOTE — MAU Note (Signed)
About the 14th, started having bad cramps above pelvic bone. Pains come and go, at first thought it was period, it came and went and the pains continue.  Has noted an odor.   Hurts to sit, very uncomfortable.

## 2013-06-03 NOTE — MAU Provider Note (Signed)
History     CSN: [id number]  Arrival date and time: 06/03/13 1509   First Provider Initiated Contact with Patient 06/03/13 1615      Chief Complaint  Patient presents with  . Pelvic Pain   HPI Lindsey Martin is 32 y.o. G2P1011 presents with vaginal pain.  LMP 05/24/13 X 5 days--normal cycle for her.  Has not used contraception for 13 yrs without pregnancy so she feels like she cannot get pregnant.  1 partner X 10 months who had STI screening prior to intercourse--"everything was negative".  The pain began 1 week prior to period and continues.  Rates 10/10.  Feels like she is having a period without the bleeding.  Walking makes it worse, getting up and sitting down hurts.  Eating makes her bloated and then the pain starts. Last MB today but stools have been loose X 3 days.  Denies fever,nausea and vomiting. No change in appetite.  Pressure in vaginal with urination. Has not had intercourse since 10/14.  Has vaginal discharge with clumps and "it doesn't smell right".  Denies hx of HSV.  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   Past Medical History  Diagnosis Date  . Ovarian cyst   . Chlamydia     Past Surgical History  Procedure Laterality Date  . Wisdom tooth extraction      History reviewed. No pertinent family history.  History  Substance Use Topics  . Smoking status: Current Every Day Smoker -- 0.25 packs/day    Types: Cigarettes  . Smokeless tobacco: Not on file  . Alcohol Use: No    Allergies:  Allergies  Allergen Reactions  . Zithromax [Azithromycin] Hives    Prescriptions prior to admission  Medication Sig Dispense Refill  . naproxen sodium (ANAPROX) 220 MG tablet Take 220 mg by mouth as needed (cramping).        Review of Systems  Constitutional: Negative for fever and chills.  Gastrointestinal: Positive for abdominal pain. Negative for nausea, vomiting, diarrhea and constipation.  Genitourinary: Negative for dysuria, urgency,  frequency and hematuria.       Neg for vaginal bleeding Positive for vaginal discharge with clumps and unusual odor  Neurological: Positive for dizziness (with pain).   Physical Exam   Blood pressure 136/86, pulse 77, temperature 98.7 F (37.1 C), temperature source Oral, resp. rate 18, height 5\' 5"  (1.651 m), weight 125 lb (56.7 kg), last menstrual period 05/24/2013.  Physical Exam  Constitutional: She is oriented to person, place, and time. She appears well-developed and well-nourished. No distress.  HENT:  Head: Normocephalic.  Neck: Normal range of motion.  Cardiovascular: Normal rate.   Respiratory: Effort normal.  GI: Soft. She exhibits no distension and no mass. There is tenderness (suprapubic). There is no rebound and no guarding.  Genitourinary: There is no rash, tenderness or lesion on the right labia. There is no rash, tenderness or lesion on the left labia. Uterus is not enlarged and not tender. Cervix exhibits motion tenderness. Cervix exhibits no discharge and no friability. There is tenderness around the vagina. No erythema or bleeding around the vagina. Vaginal discharge (moderate amount of white creamy discharge with odor) found.  Neurological: She is alert and oriented to person, place, and time.  Skin: Skin is warm and dry.  Psychiatric: She has a normal mood and affect. Her behavior is normal.   Results for orders placed during the hospital encounter of 06/03/13 (from the past 24 hour(s))  URINALYSIS, ROUTINE W REFLEX MICROSCOPIC     Status: None   Collection Time    06/03/13  3:35 PM      Result Value Range   Color, Urine YELLOW  YELLOW   APPearance CLEAR  CLEAR   Specific Gravity, Urine 1.025  1.005 - 1.030   pH 6.0  5.0 - 8.0   Glucose, UA NEGATIVE  NEGATIVE mg/dL   Hgb urine dipstick NEGATIVE  NEGATIVE   Bilirubin Urine NEGATIVE  NEGATIVE   Ketones, ur NEGATIVE  NEGATIVE mg/dL   Protein, ur NEGATIVE  NEGATIVE mg/dL   Urobilinogen, UA 0.2  0.0 - 1.0 mg/dL    Nitrite NEGATIVE  NEGATIVE   Leukocytes, UA NEGATIVE  NEGATIVE  POCT PREGNANCY, URINE     Status: None   Collection Time    06/03/13  3:38 PM      Result Value Range   Preg Test, Ur NEGATIVE  NEGATIVE  CBC WITH DIFFERENTIAL     Status: Abnormal   Collection Time    06/03/13  4:35 PM      Result Value Range   WBC 11.1 (*) 4.0 - 10.5 K/uL   RBC 4.34  3.87 - 5.11 MIL/uL   Hemoglobin 13.7  12.0 - 15.0 g/dL   HCT 40.9  81.1 - 91.4 %   MCV 93.1  78.0 - 100.0 fL   MCH 31.6  26.0 - 34.0 pg   MCHC 33.9  30.0 - 36.0 g/dL   RDW 78.2  95.6 - 21.3 %   Platelets 251  150 - 400 K/uL   Neutrophils Relative % 63  43 - 77 %   Neutro Abs 7.0  1.7 - 7.7 K/uL   Lymphocytes Relative 28  12 - 46 %   Lymphs Abs 3.1  0.7 - 4.0 K/uL   Monocytes Relative 8  3 - 12 %   Monocytes Absolute 0.9  0.1 - 1.0 K/uL   Eosinophils Relative 1  0 - 5 %   Eosinophils Absolute 0.1  0.0 - 0.7 K/uL   Basophils Relative 0  0 - 1 %   Basophils Absolute 0.0  0.0 - 0.1 K/uL  WET PREP, GENITAL     Status: Abnormal   Collection Time    06/03/13  4:45 PM      Result Value Range   Yeast Wet Prep HPF POC NONE SEEN  NONE SEEN   Trich, Wet Prep NONE SEEN  NONE SEEN   Clue Cells Wet Prep HPF POC MANY (*) NONE SEEN   WBC, Wet Prep HPF POC RARE (*) NONE SEEN   US Transvaginal Non-ob  06/03/2013   CLINICAL DATA:  Pelvic pain for 2 weeks  EXAM: TRANSABDOMINAL AND TRANSVAGINAL ULTRASOUND OF PELVIS  TECHNIQUE: Both transabdominal and transvaginal ultrasound examinations of the pelvis were performed. Transabdominal technique was performed for global imaging of the pelvis including uterus, ovaries, adnexal regions, and pelvic cul-de-sac. It was necessary to proceed with endovaginal exam following the transabdominal exam to visualize the endometrium and ovaries.  COMPARISON:  CT 12/12/2011. Pelvic ultrasound 11/06/2004.  FINDINGS: Uterus  Measurements: 6.2 x 4.6 x 3.5 cm. Anteverted, mildly retroflexed which renders visualization of the  fundus suboptimal. No fibroids or other mass visualized.  Endometrium  Thickness: 9 mm. Borderline trilaminar in appearance without focal abnormality. No focal abnormality visualized.  Right ovary  Measurements: 4.0 x 2.9 x 1.7 cm. Normal appearance/no adnexal mass.  Left ovary  Measurements: 3.2 x 3.1 x 1.6 cm. Normal  appearance/no adnexal mass.  Other findings  No free fluid.  IMPRESSION: Normal exam.   Electronically Signed   By: Christiana Pellant M.D.   On: 06/03/2013 18:33   US Pelvis Complete  06/03/2013   CLINICAL DATA:  Pelvic pain for 2 weeks  EXAM: TRANSABDOMINAL AND TRANSVAGINAL ULTRASOUND OF PELVIS  TECHNIQUE: Both transabdominal and transvaginal ultrasound examinations of the pelvis were performed. Transabdominal technique was performed for global imaging of the pelvis including uterus, ovaries, adnexal regions, and pelvic cul-de-sac. It was necessary to proceed with endovaginal exam following the transabdominal exam to visualize the endometrium and ovaries.  COMPARISON:  CT 12/12/2011. Pelvic ultrasound 11/06/2004.  FINDINGS: Uterus  Measurements: 6.2 x 4.6 x 3.5 cm. Anteverted, mildly retroflexed which renders visualization of the fundus suboptimal. No fibroids or other mass visualized.  Endometrium  Thickness: 9 mm. Borderline trilaminar in appearance without focal abnormality. No focal abnormality visualized.  Right ovary  Measurements: 4.0 x 2.9 x 1.7 cm. Normal appearance/no adnexal mass.  Left ovary  Measurements: 3.2 x 3.1 x 1.6 cm. Normal appearance/no adnexal mass.  Other findings  No free fluid.  IMPRESSION: Normal exam.   Electronically Signed   By: Christiana Pellant M.D.   On: 06/03/2013 18:33   MAU Course  Procedures  GC/CHL culture to lab  MDM Toradol 60mg  IM given for discomfort.  Seemed to help until after ultrasound, pain is more now.  Did not offer narcotics because of hx of drug use/addiction.    Assessment and Plan  A:  Suprapubic pain      Neg for gyn cause of pain      Bacterial vaginosis  P:  Rx for Flagyl      Instructed to have pelvic rest and avoid ETOH while being treated      If sxs continue or worsen instructed to go to [facility] for evaluation--suprapubic pain with neg UA      May use additional Ibuprofen tomorrow--do not take any tonight  Roanne Haye,EVE M 06/03/2013, 6:35 PM

## 2013-06-04 LAB — GC/CHLAMYDIA PROBE AMP
CT Probe RNA: NEGATIVE
GC Probe RNA: NEGATIVE

## 2013-06-04 NOTE — MAU Provider Note (Signed)
Attestation of Attending Supervision of Advanced Practitioner (CNM/NP): Evaluation and management procedures were performed by the Advanced Practitioner under my supervision and collaboration.  I have reviewed the Advanced Practitioner's note and chart, and I agree with the management and plan.  HARRAWAY-SMITH, Luddie Boghosian 6:41 AM     

## 2013-07-07 ENCOUNTER — Emergency Department
Admission: EM | Admit: 2013-07-07 | Discharge: 2013-07-07 | Disposition: A | Discharge status: Home / Self Care | Payer: Self-pay | Source: Home / Self Care

## 2013-07-07 ENCOUNTER — Encounter: Payer: Self-pay | Admitting: Emergency Medicine

## 2013-07-07 DIAGNOSIS — J029 Acute pharyngitis, unspecified: Secondary | ICD-10-CM

## 2013-07-07 DIAGNOSIS — J069 Acute upper respiratory infection, unspecified: Secondary | ICD-10-CM

## 2013-07-07 DIAGNOSIS — Z716 Tobacco abuse counseling: Secondary | ICD-10-CM

## 2013-07-07 LAB — POCT RAPID STREP A (OFFICE): Rapid Strep A Screen: NEGATIVE

## 2013-07-07 NOTE — ED Notes (Signed)
Sore throat, ears hurt x 1 day

## 2013-07-07 NOTE — ED Provider Notes (Signed)
CSN: [id number]     Arrival date & time 07/07/13  1543 History   None    Chief Complaint  Patient presents with  . Sore Throat    HPI  URI Symptoms Onset: 1 day  Description: rhinorrhea, nasal congestion, sore throat, cough  Modifying factors:  1/4 PPD smoker; + strep exposure in sister with similar sxs.   Symptoms Nasal discharge: yes Fever: no Sore throat: yes Cough: yes Wheezing: no Ear pain: yes GI symptoms: no Sick contacts: yes; multiple family members ill with viral illnesses   Red Flags  Stiff neck: no Dyspnea: no Rash: no Swallowing difficulty: no  Sinusitis Risk Factors Headache/face pain: no Double sickening: no tooth pain: no  Allergy Risk Factors Sneezing: yes Itchy scratchy throat: no Seasonal symptoms: no  Flu Risk Factors Headache: no muscle aches: no severe fatigue: no   Past Medical History  Diagnosis Date  . Ovarian cyst   . Chlamydia    Past Surgical History  Procedure Laterality Date  . Wisdom tooth extraction     No family history on file. History  Substance Use Topics  . Smoking status: Current Every Day Smoker -- 0.25 packs/day for 20 years    Types: Cigarettes  . Smokeless tobacco: Not on file  . Alcohol Use: No   OB History   Grav Para Term Preterm Abortions TAB SAB Ect Mult Living   2 1 1  1   1  1      Review of Systems  All other systems reviewed and are negative.    Allergies  Zithromax  Home Medications   Current Outpatient Rx  Name  Route  Sig  Dispense  Refill  . metroNIDAZOLE (FLAGYL) 500 MG tablet   Oral   Take 1 tablet (500 mg total) by mouth 2 (two) times daily.   14 tablet   0   . naproxen sodium (ANAPROX) 220 MG tablet   Oral   Take 220 mg by mouth as needed (cramping).          BP 104/70  Pulse 84  Temp(Src) 98.6 F (37 C) (Oral)  Ht 5\' 6"  (1.676 m)  Wt 129 lb (58.514 kg)  BMI 20.83 kg/m2  SpO2 100%  LMP 06/24/2013 Physical Exam  Constitutional: She is oriented to person,  place, and time. She appears well-developed and well-nourished.  HENT:  Head: Normocephalic and atraumatic.  Right Ear: External ear normal.  Left Ear: External ear normal.  Eyes: Conjunctivae are normal. Pupils are equal, round, and reactive to light.  Neck: Normal range of motion. Neck supple.  Cardiovascular: Normal rate and regular rhythm.   Pulmonary/Chest: Effort normal and breath sounds normal.  Abdominal: Soft. Bowel sounds are normal.  Musculoskeletal: Normal range of motion.  Neurological: She is alert and oriented to person, place, and time.  Skin: Skin is warm.    ED Course  Procedures (including critical care time) Labs Review Labs Reviewed - No data to display Imaging Review No results found.  EKG Interpretation    Date/Time:    Ventricular Rate:    PR Interval:    QRS Duration:   QT Interval:    QTC Calculation:   R Axis:     Text Interpretation:              MDM   1. Acute pharyngitis   2. Tobacco abuse counseling   3. URI (upper respiratory infection)    Suspect likely viral illness Rapid strep negative. Will culture  Day 1 of illness Discussed supportive care and infectious red flags.  Quit smoking  Follow up as needed.     The patient and/or caregiver has been counseled thoroughly with regard to treatment plan and/or medications prescribed including dosage, schedule, interactions, rationale for use, and possible side effects and they verbalize understanding. Diagnoses and expected course of recovery discussed and will return if not improved as expected or if the condition worsens. Patient and/or caregiver verbalized understanding.         Doree Albee, MD 07/07/13 [phone]

## 2013-07-08 LAB — STREP A DNA PROBE: GASP: NEGATIVE

## 2013-07-11 ENCOUNTER — Telehealth: Payer: Self-pay | Admitting: Emergency Medicine

## 2013-07-20 ENCOUNTER — Emergency Department (INDEPENDENT_AMBULATORY_CARE_PROVIDER_SITE_OTHER): Payer: Self-pay

## 2013-07-20 ENCOUNTER — Emergency Department
Admission: EM | Admit: 2013-07-20 | Discharge: 2013-07-20 | Disposition: A | Discharge status: Home / Self Care | Payer: Self-pay | Source: Home / Self Care | Attending: Family Medicine | Admitting: Family Medicine

## 2013-07-20 ENCOUNTER — Encounter: Payer: Self-pay | Admitting: Emergency Medicine

## 2013-07-20 DIAGNOSIS — R05 Cough: Secondary | ICD-10-CM

## 2013-07-20 DIAGNOSIS — R059 Cough, unspecified: Secondary | ICD-10-CM

## 2013-07-20 DIAGNOSIS — J209 Acute bronchitis, unspecified: Secondary | ICD-10-CM

## 2013-07-20 MED ORDER — AMOXICILLIN 875 MG PO TABS: 875.0000 mg | ORAL_TABLET | Freq: Two times a day (BID) | ORAL | Status: DC

## 2013-07-20 MED ORDER — BENZONATATE 200 MG PO CAPS: 200.0000 mg | ORAL_CAPSULE | Freq: Every day | ORAL | Status: DC

## 2013-07-20 MED ORDER — HYDROCODONE-ACETAMINOPHEN 5-325 MG PO TABS: ORAL_TABLET | ORAL | Status: DC

## 2013-07-20 NOTE — ED Notes (Signed)
Pt c/o cough, chest congestion, blisters on throat, and RT ear ache x 07/06/13. She took IBF at 1000 today.

## 2013-07-20 NOTE — ED Provider Notes (Signed)
CSN: [id number]     Arrival date & time 07/20/13  1109 History   First MD Initiated Contact with Patient 07/20/13 1155     Chief Complaint  Patient presents with  . Otalgia  . Sore Throat  . Cough      HPI Comments: Since patient's previous visit about two weeks ago she has had persistent non-productive cough, sore throat, sinus congestion, and ear congestion.  She now coughs until she gags.  She has had sweats for about two days. Her Tdap is current.  She has not had a flu shot  The history is provided by the patient.    Past Medical History  Diagnosis Date  . Ovarian cyst   . Chlamydia    Past Surgical History  Procedure Laterality Date  . Wisdom tooth extraction     History reviewed. No pertinent family history. History  Substance Use Topics  . Smoking status: Current Every Day Smoker -- 0.25 packs/day for 20 years    Types: Cigarettes  . Smokeless tobacco: Not on file  . Alcohol Use: No   OB History   Grav Para Term Preterm Abortions TAB SAB Ect Mult Living   2 1 1  1   1  1      Review of Systems + sore throat + cough + pleuritic pain on right + wheezing + nasal congestion + post-nasal drainage No sinus pain/pressure No itchy/red eyes ? earache No hemoptysis + SOB No fever, + chills/sweats No nausea No vomiting No abdominal pain No diarrhea No urinary symptoms No skin rash + fatigue + myalgias No headache Used OTC meds without relief  Allergies  Zithromax  Home Medications   Current Outpatient Rx  Name  Route  Sig  Dispense  Refill  . amoxicillin (AMOXIL) 875 MG tablet   Oral   Take 1 tablet (875 mg total) by mouth 2 (two) times daily.   28 tablet   0   . benzonatate (TESSALON) 200 MG capsule   Oral   Take 1 capsule (200 mg total) by mouth at bedtime. Take as needed for cough   12 capsule   0   . HYDROcodone-acetaminophen (NORCO/VICODIN) 5-325 MG per tablet      Take one by mouth at bedtime as needed for pain   10 tablet   0    . metroNIDAZOLE (FLAGYL) 500 MG tablet   Oral   Take 1 tablet (500 mg total) by mouth 2 (two) times daily.   14 tablet   0   . naproxen sodium (ANAPROX) 220 MG tablet   Oral   Take 220 mg by mouth as needed (cramping).          BP 110/76  Pulse 98  Temp(Src) 98.7 F (37.1 C) (Oral)  Resp 16  Ht 5\' 5"  (1.651 m)  Wt 126 lb (57.153 kg)  BMI 20.97 kg/m2  SpO2 100%  LMP 06/24/2013 Physical Exam Nursing notes and Vital Signs reviewed. Appearance:  Patient appears healthy, stated age, and in no acute distress Eyes:  Pupils are equal, round, and reactive to light and accomodation.  Extraocular movement is intact.  Conjunctivae are not inflamed  Ears:  Canals normal.  Tympanic membranes normal.  Nose:  Mildly congested turbinates.  No sinus tenderness.  Pharynx:  Normal Neck:  Supple.  Slightly tender shotty posterior nodes are palpated bilaterally  Lungs:  Clear to auscultation.  Breath sounds are equal.  Chest:  Distinct tenderness to palpation over the mid-sternum, and  just to right of sternum Heart:  Regular rate and rhythm without murmurs, rubs, or gallops.  Abdomen:  Nontender without masses or hepatosplenomegaly.  Bowel sounds are present.  No CVA or flank tenderness.  Extremities:  No edema.  No calf tenderness Skin:  No rash present.   ED Course  Procedures  none    Imaging Review Dg Chest 2 View  07/20/2013   CLINICAL DATA:  Cough and pleuritic right chest pain.  EXAM: CHEST  2 VIEW  COMPARISON:  12/17/2008.  FINDINGS: The cardiac silhouette, mediastinal and hilar contours are within normal limits and stable. The lungs are clear. No pleural effusion. The bony thorax is intact.  IMPRESSION: No acute cardiopulmonary findings.   Electronically Signed   By: Loralie Champagne M.D.   On: 07/20/2013 12:46      MDM   1. Acute bronchitis    Begin amoxicillin.  Prescription written for Benzonatate [facility]) to take at bedtime for night-time cough. Lortab for pain at  bedtime. Take Mucinex D (1200mg  guaifenesin with decongestant) twice daily for congestion.  Increase fluid intake, rest. Try warm salt water gargles for sore throat.  Stop all antihistamines for now, and other non-prescription cough/cold preparations. May take Ibuprofen 200mg , 4 tabs every 8 hours with food for chest/sternum discomfort. Followup with Family Doctor if not improved in one week.     Lattie Haw, MD 07/20/13 [phone]

## 2013-11-17 ENCOUNTER — Inpatient Hospital Stay (HOSPITAL_COMMUNITY): Payer: No Typology Code available for payment source

## 2013-11-17 ENCOUNTER — Inpatient Hospital Stay (HOSPITAL_COMMUNITY)
Admission: AD | Admit: 2013-11-17 | Discharge: 2013-11-17 | Disposition: A | Discharge status: Home / Self Care | Payer: No Typology Code available for payment source | Source: Ambulatory Visit | Attending: Obstetrics & Gynecology | Admitting: Obstetrics & Gynecology

## 2013-11-17 ENCOUNTER — Encounter (HOSPITAL_COMMUNITY): Payer: Self-pay | Admitting: *Deleted

## 2013-11-17 DIAGNOSIS — N938 Other specified abnormal uterine and vaginal bleeding: Secondary | ICD-10-CM | POA: Insufficient documentation

## 2013-11-17 DIAGNOSIS — F17200 Nicotine dependence, unspecified, uncomplicated: Secondary | ICD-10-CM | POA: Insufficient documentation

## 2013-11-17 DIAGNOSIS — N949 Unspecified condition associated with female genital organs and menstrual cycle: Secondary | ICD-10-CM | POA: Insufficient documentation

## 2013-11-17 DIAGNOSIS — N926 Irregular menstruation, unspecified: Secondary | ICD-10-CM | POA: Insufficient documentation

## 2013-11-17 DIAGNOSIS — R109 Unspecified abdominal pain: Secondary | ICD-10-CM | POA: Insufficient documentation

## 2013-11-17 LAB — CBC
HCT: 44.3 % (ref 36.0–46.0)
Hemoglobin: 15.1 g/dL — ABNORMAL HIGH (ref 12.0–15.0)
MCH: 32.3 pg (ref 26.0–34.0)
MCHC: 34.1 g/dL (ref 30.0–36.0)
MCV: 94.7 fL (ref 78.0–100.0)
Platelets: 269 10*3/uL (ref 150–400)
RBC: 4.68 MIL/uL (ref 3.87–5.11)
RDW: 13.7 % (ref 11.5–15.5)
WBC: 11.6 10*3/uL — ABNORMAL HIGH (ref 4.0–10.5)

## 2013-11-17 LAB — COMPREHENSIVE METABOLIC PANEL
ALT: 14 U/L (ref 0–35)
AST: 19 U/L (ref 0–37)
Albumin: 4 g/dL (ref 3.5–5.2)
Alkaline Phosphatase: 59 U/L (ref 39–117)
BUN: 7 mg/dL (ref 6–23)
CO2: 29 mEq/L (ref 19–32)
Calcium: 9.6 mg/dL (ref 8.4–10.5)
Chloride: 102 mEq/L (ref 96–112)
Creatinine, Ser: 0.88 mg/dL (ref 0.50–1.10)
GFR calc Af Amer: >90 mL/min (ref >90)
GFR calc non Af Amer: 84 mL/min — ABNORMAL LOW (ref >90)
Glucose, Bld: 100 mg/dL — ABNORMAL HIGH (ref 70–99)
Potassium: 3.7 mEq/L (ref 3.7–5.3)
Sodium: 141 mEq/L (ref 137–147)
Total Bilirubin: 0.3 mg/dL (ref 0.3–1.2)
Total Protein: 7.5 g/dL (ref 6.0–8.3)

## 2013-11-17 LAB — URINALYSIS, ROUTINE W REFLEX MICROSCOPIC
Bilirubin Urine: NEGATIVE
Glucose, UA: NEGATIVE mg/dL
Ketones, ur: NEGATIVE mg/dL
Leukocytes, UA: NEGATIVE
Nitrite: NEGATIVE
Protein, ur: NEGATIVE mg/dL
Specific Gravity, Urine: 1.02 (ref 1.005–1.030)
Urobilinogen, UA: 0.2 mg/dL (ref 0.0–1.0)
pH: 7 (ref 5.0–8.0)

## 2013-11-17 LAB — URINE MICROSCOPIC-ADD ON

## 2013-11-17 LAB — POCT PREGNANCY, URINE: Preg Test, Ur: NEGATIVE

## 2013-11-17 MED ORDER — MEGESTROL ACETATE 40 MG PO TABS
40.0000 mg | ORAL_TABLET | Freq: Every day | ORAL | Status: DC
Start: 2013-11-17 — End: 2014-06-20

## 2013-11-17 NOTE — Discharge Instructions (Signed)

## 2013-11-17 NOTE — MAU Provider Note (Signed)
History     CSN: [id number]  Arrival date and time: 11/17/13 1542   First Provider Initiated Contact with Patient 11/17/13 1637      Chief Complaint  Patient presents with  . Abdominal Pain  . Vaginal Bleeding   HPI This is a 33 y.o. female who presents with c/o irregular menstrual bleeding since early March. States cycles have been regular her whole life. But has had bleeding every 2 weeks since early March. Has PMS symptoms off and on, as well as cramping.   Does not want STD testing.   RN Note:  Had period first wk in March- started as expected, had a 'yeast infection' after. Since then has had bleeding off and on since then bleeding will last for 3-4 days. Cramping in lower abd. Pt diaphoretic and felt she was going to pass out when called back to rm. States breasts are tender, feels anxious. Just doesn't feel right.       OB History   Grav Para Term Preterm Abortions TAB SAB Ect Mult Living   2 1 1  1   1  1       Past Medical History  Diagnosis Date  . Ovarian cyst   . Chlamydia     Past Surgical History  Procedure Laterality Date  . Wisdom tooth extraction      History reviewed. No pertinent family history.  History  Substance Use Topics  . Smoking status: Current Every Day Smoker -- 0.25 packs/day for 20 years    Types: Cigarettes  . Smokeless tobacco: Not on file  . Alcohol Use: No    Allergies:  Allergies  Allergen Reactions  . Zithromax [Azithromycin] Hives    Prescriptions prior to admission  Medication Sig Dispense Refill  . ibuprofen (ADVIL,MOTRIN) 200 MG tablet Take 200 mg by mouth every 6 (six) hours as needed.        Review of Systems  Constitutional: Positive for malaise/fatigue. Negative for fever and chills.  Gastrointestinal: Positive for abdominal pain. Negative for nausea, vomiting, diarrhea and constipation.  Genitourinary: Negative for dysuria.       Irregular "heavy" bleeding   Neurological: Positive for dizziness. Negative  for headaches.   Physical Exam   Blood pressure 130/87, pulse 63, temperature 98.5 F (36.9 C), temperature source Oral, resp. rate 20, height 5\' 6"  (1.676 m), weight 52.164 kg (115 lb), SpO2 100.00%.  Physical Exam  Constitutional: She is oriented to person, place, and time. She appears well-developed and well-nourished. No distress.  HENT:  Head: Normocephalic.  Cardiovascular: Normal rate.   Respiratory: Effort normal.  GI: Soft. She exhibits no distension. There is tenderness (slight tenderness over uterus). There is no rebound and no guarding.  Genitourinary: Uterus normal. Vaginal discharge (moderate blood in vault. Retained tampon removed. Pt was unaware of it being there, does not remember putting it in.) found.  Musculoskeletal: Normal range of motion.  Neurological: She is alert and oriented to person, place, and time.  Skin: Skin is warm and dry.  Psychiatric: She has a normal mood and affect.    MAU Course  Procedures  MDM Results for orders placed during the hospital encounter of 11/17/13 (from the past 24 hour(s))  URINALYSIS, ROUTINE W REFLEX MICROSCOPIC     Status: Abnormal   Collection Time    11/17/13  4:15 PM      Result Value Ref Range   Color, Urine YELLOW  YELLOW   APPearance CLEAR  CLEAR   Specific Gravity,  Urine 1.020  1.005 - 1.030   pH 7.0  5.0 - 8.0   Glucose, UA NEGATIVE  NEGATIVE mg/dL   Hgb urine dipstick LARGE (*) NEGATIVE   Bilirubin Urine NEGATIVE  NEGATIVE   Ketones, ur NEGATIVE  NEGATIVE mg/dL   Protein, ur NEGATIVE  NEGATIVE mg/dL   Urobilinogen, UA 0.2  0.0 - 1.0 mg/dL   Nitrite NEGATIVE  NEGATIVE   Leukocytes, UA NEGATIVE  NEGATIVE  URINE MICROSCOPIC-ADD ON     Status: Abnormal   Collection Time    11/17/13  4:15 PM      Result Value Ref Range   Squamous Epithelial / LPF MANY (*) RARE   WBC, UA 0-2  <3 WBC/hpf   RBC / HPF 0-2  <3 RBC/hpf   Bacteria, UA MANY (*) RARE   Urine-Other AMORPHOUS URATES/PHOSPHATES    POCT PREGNANCY,  URINE     Status: None   Collection Time    11/17/13  4:45 PM      Result Value Ref Range   Preg Test, Ur NEGATIVE  NEGATIVE  CBC     Status: Abnormal   Collection Time    11/17/13  5:06 PM      Result Value Ref Range   WBC 11.6 (*) 4.0 - 10.5 K/uL   RBC 4.68  3.87 - 5.11 MIL/uL   Hemoglobin 15.1 (*) 12.0 - 15.0 g/dL   HCT 44.3  36.0 - 46.0 %   MCV 94.7  78.0 - 100.0 fL   MCH 32.3  26.0 - 34.0 pg   MCHC 34.1  30.0 - 36.0 g/dL   RDW 13.7  11.5 - 15.5 %   Platelets 269  150 - 400 K/uL  COMPREHENSIVE METABOLIC PANEL     Status: Abnormal   Collection Time    11/17/13  5:06 PM      Result Value Ref Range   Sodium 141  137 - 147 mEq/L   Potassium 3.7  3.7 - 5.3 mEq/L   Chloride 102  96 - 112 mEq/L   CO2 29  19 - 32 mEq/L   Glucose, Bld 100 (*) 70 - 99 mg/dL   BUN 7  6 - 23 mg/dL   Creatinine, Ser 0.88  0.50 - 1.10 mg/dL   Calcium 9.6  8.4 - 10.5 mg/dL   Total Protein 7.5  6.0 - 8.3 g/dL   Albumin 4.0  3.5 - 5.2 g/dL   AST 19  0 - 37 U/L   ALT 14  0 - 35 U/L   Alkaline Phosphatase 59  39 - 117 U/L   Total Bilirubin 0.3  0.3 - 1.2 mg/dL   GFR calc non Af Amer 84 (*) >90 mL/min   GFR calc Af Amer >90  >90 mL/min   US Pelvis Complete  11/17/2013   CLINICAL DATA:  Irregular bleeding.  EXAM: TRANSABDOMINAL AND TRANSVAGINAL ULTRASOUND OF PELVIS  TECHNIQUE: Both transabdominal and transvaginal ultrasound examinations of the pelvis were performed. Transabdominal technique was performed for global imaging of the pelvis including uterus, ovaries, adnexal regions, and pelvic cul-de-sac. It was necessary to proceed with endovaginal exam following the transabdominal exam to visualize the adnexa.  COMPARISON:  US PELVIS COMPLETE dated 06/03/2013    FINDINGS: Uterus  Measurements: 8.8 x 4.0 x 4.2 cm. No fibroids or other mass visualized.  Endometrium  Thickness: 6 mm in thickness and uniform. No focal abnormality visualized.  Right ovary  Measurements: . Normal appearance/no adnexal mass.  Left  ovary  Measurements: . Normal appearance/no adnexal mass.  Other findings  No free fluid.    IMPRESSION: Within normal limits.     Electronically Signed   By: Maryclare Bean M.D.   On: 11/17/2013 18:01    Assessment and Plan  A:  Dysfunctional Uterine Bleeding       Normal hemoglobin       Probable anovulatory bleeding  P:  Discussed results       Relieved "nothing is wrong"       Will Rx Megace to slow bleeding until longterm plans can be made       Ordered TSH but tube not drawn, so order cancelled       Will follow up in GYN clinic. Discussed they may suggest Mirena IUD.  Seabron Spates 11/17/2013, 4:55 PM

## 2013-11-17 NOTE — MAU Note (Addendum)
Had period first wk in March- started as expected, had a 'yeast infection' after.  Since then has had bleeding off and on since then bleeding will last for 3-4 days. Cramping in lower abd.  Pt diaphoretic and felt she was going to pass out when called back to rm.  States breasts are tender, feels anxious.  Just doesn't feel right.

## 2013-11-21 NOTE — MAU Provider Note (Signed)
Attestation of Attending Supervision of Advanced Practitioner (PA/CNM/NP): Evaluation and management procedures were performed by the Advanced Practitioner under my supervision and collaboration.  I have reviewed the Advanced Practitioner's note and chart, and I agree with the management and plan.  Loralyn Rachel, MD, FACOG Attending Obstetrician & Gynecologist Faculty Practice, [facility]  

## 2014-01-11 ENCOUNTER — Encounter: Payer: No Typology Code available for payment source | Admitting: Obstetrics & Gynecology

## 2014-06-04 DIAGNOSIS — S63599A Other specified sprain of unspecified wrist, initial encounter: Secondary | ICD-10-CM

## 2014-06-04 HISTORY — DX: Other specified sprain of unspecified wrist, initial encounter: S63.599A

## 2014-06-05 ENCOUNTER — Encounter (HOSPITAL_COMMUNITY): Payer: Self-pay | Admitting: *Deleted

## 2014-06-19 ENCOUNTER — Other Ambulatory Visit: Payer: Self-pay | Admitting: Orthopedic Surgery

## 2014-06-20 ENCOUNTER — Encounter (HOSPITAL_BASED_OUTPATIENT_CLINIC_OR_DEPARTMENT_OTHER): Payer: Self-pay | Admitting: *Deleted

## 2014-06-22 NOTE — H&P (Signed)
Lindsey Martin is an 33 y.o. female.   CC / Reason for Visit: Right wrist problem HPI: This patient returns for reevaluation, having undergone an interval MRI scan on 06-14-14.  This reveals an ununited ulnar styloid fracture, central TFCC perforation, and some degenerative changes.  Intercarpal ligaments appear to be intact.  In addition, ulnar positivity is noted.  She reports that her ulnar-sided pain persists, as bad as ever, and that she also has some tingling in the superficial radial nerve distribution.  She is relying upon a wrist splint worn quite regularly.  HPI from 05-22-14: This patient returns for reevaluation indicating that her wrist mostly hurts in the morning and at nighttime.  She has a long time history of using Norco that she reports is for psoriasis.  She tends to hold the wrist radially deviated as she has less ulnar-sided pain with that.  She thinks that the swelling continues and at times is worse at the distal ulna.  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  Apparently they are running out of light-duty work for her to perform.    Presenting history follows: This patient is a 33 year old female who presents for evaluation of a right wrist problem.  She reports that many years ago she believes that she injured her right arm.  More recently, in the last several years she has developed ulnar-sided wrist pain that is quite intense at times.  With that as a baseline, she tripped going up the stairs and fell by her report on the date above.  4 days later due to continued pain and swelling she presented to the Urgent Care at [facility], had x-rays obtained, and was subsequently referred to [facility].  She was placed into a [place] cast, and presented for further evaluation to Dr. Mardelle Matte yesterday.  The patient reports that the cast is fairly uncomfortable and she has a history of psoriasis.  Past Medical History  Diagnosis Date  . 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 Psoriasis     elbows, knees, left ankle  . Sensitive skin   . Teeth grinding   . TMJ locking     right jaw    Past Surgical History  Procedure Laterality Date  . Wisdom tooth extraction    . Cyst excision Right 11/19/2000    labium majus    Family History  Problem Relation Age of Onset  . Heart disease Mother     CABG   Social History:  reports that she has been smoking Cigarettes.  She has a 5.94 pack-year smoking history. She has never used smokeless tobacco. She reports that she uses illicit drugs (Marijuana). She reports that she does not drink alcohol.  Allergies:  Allergies  Allergen Reactions  . Zithromax [Azithromycin] Hives    No prescriptions prior to admission    No results found for this or any previous visit (from the past 41 hour(s)). No results found.  Review of Systems  All other systems reviewed and are negative.   Height 5\' 6"  (1.676 m), weight 54.432 kg (120 lb), last menstrual period 05/24/2014. Physical Exam  Constitutional:  WD, WN, NAD HEENT:  NCAT, EOMI Neuro/Psych:  Alert & oriented to person, place, and time; appropriate mood & affect Lymphatic: No generalized UE edema or lymphadenopathy Extremities / MSK:  Both UE are normal with respect to appearance, ranges of motion, joint stability, muscle strength/tone,  sensation, & perfusion except as otherwise noted:  She is mildly tender at the distal radial metaphysis, but much more tender at the interval between the ulna and lunate.  Minimal tenderness at the tip of the ulnar styloid.  With percussion over the SRN at the distal forearm, she has some tingling that radiates distally.  Increased ulnar-sided pain with passive and active ulnar deviation of the wrist Elbow flexion and extension is full, good pronation and supination, wrist flexion and extension limited by discomfort.  Labs / X-rays: X-rays from 05-22-14: PA right wrist  radiograph with the forearm in neutral rotation today reveals ulnar positivity of 3.3 mm.  There appears to be some cystic changes in the ulnar side of the lunate proximally.  Previous x-rays reviewed, revealing some sclerosis through the distal radial metaphysis consistent with early stages of a healing fracture, superimposed upon underlying slight distal radius malunion with some resultant shortening and ulnar positivity, especially when compared to the contralateral side x-rays.  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  I recommended wrist arthroscopy with TFCC debridement, followed by ulnar shortening osteotomy.  She was prescribed just a few Norco to last the rest of the week until surgery on Friday.  The details of the operative procedure were discussed with the patient.  Questions were invited and answered.  In addition to the goal of the procedure, the risks of the procedure to include but not limited to bleeding; infection; damage to the nerves or blood vessels that could result in bleeding, numbness, weakness, chronic pain, and the need for additional procedures; stiffness; the need for revision surgery; and anesthetic risks, were reviewed.  No specific outcome was guaranteed or implied.  Informed consent was obtained.  [place], Woodruff Skirvin A. 06/22/2014, 2:20 PM

## 2014-06-23 ENCOUNTER — Ambulatory Visit (HOSPITAL_COMMUNITY): Payer: No Typology Code available for payment source

## 2014-06-23 ENCOUNTER — Ambulatory Visit (HOSPITAL_BASED_OUTPATIENT_CLINIC_OR_DEPARTMENT_OTHER)
Admission: RE | Admit: 2014-06-23 | Discharge: 2014-06-23 | Disposition: A | Discharge status: Home / Self Care | Payer: No Typology Code available for payment source | Source: Ambulatory Visit | Attending: Orthopedic Surgery | Admitting: Orthopedic Surgery

## 2014-06-23 ENCOUNTER — Ambulatory Visit (HOSPITAL_BASED_OUTPATIENT_CLINIC_OR_DEPARTMENT_OTHER): Payer: No Typology Code available for payment source | Admitting: Certified Registered"

## 2014-06-23 ENCOUNTER — Encounter (HOSPITAL_BASED_OUTPATIENT_CLINIC_OR_DEPARTMENT_OTHER)
Admission: RE | Disposition: A | Discharge status: Home / Self Care | Payer: Self-pay | Source: Ambulatory Visit | Attending: Orthopedic Surgery

## 2014-06-23 ENCOUNTER — Encounter (HOSPITAL_BASED_OUTPATIENT_CLINIC_OR_DEPARTMENT_OTHER): Payer: Self-pay | Admitting: Certified Registered"

## 2014-06-23 DIAGNOSIS — S63591D Other specified sprain of right wrist, subsequent encounter: Secondary | ICD-10-CM | POA: Diagnosis not present

## 2014-06-23 DIAGNOSIS — Z881 Allergy status to other antibiotic agents status: Secondary | ICD-10-CM | POA: Insufficient documentation

## 2014-06-23 DIAGNOSIS — F17210 Nicotine dependence, cigarettes, uncomplicated: Secondary | ICD-10-CM | POA: Insufficient documentation

## 2014-06-23 DIAGNOSIS — Z8781 Personal history of (healed) traumatic fracture: Secondary | ICD-10-CM

## 2014-06-23 DIAGNOSIS — M25531 Pain in right wrist: Secondary | ICD-10-CM | POA: Diagnosis present

## 2014-06-23 DIAGNOSIS — S52591P Other fractures of lower end of right radius, subsequent encounter for closed fracture with malunion: Secondary | ICD-10-CM | POA: Insufficient documentation

## 2014-06-23 DIAGNOSIS — Z9889 Other specified postprocedural states: Secondary | ICD-10-CM

## 2014-06-23 DIAGNOSIS — X58XXXD Exposure to other specified factors, subsequent encounter: Secondary | ICD-10-CM | POA: Diagnosis not present

## 2014-06-23 DIAGNOSIS — S52691P Other fracture of lower end of right ulna, subsequent encounter for closed fracture with malunion: Secondary | ICD-10-CM | POA: Diagnosis not present

## 2014-06-23 DIAGNOSIS — L409 Psoriasis, unspecified: Secondary | ICD-10-CM | POA: Diagnosis not present

## 2014-06-23 HISTORY — DX: Psoriasis, unspecified: L40.9

## 2014-06-23 HISTORY — DX: Other somatoform disorders: F45.8

## 2014-06-23 HISTORY — DX: Other specified disorders of temporomandibular joint: M26.69

## 2014-06-23 HISTORY — PX: ULNA OSTEOTOMY: SHX1077

## 2014-06-23 HISTORY — PX: WRIST ARTHROSCOPY: SHX838

## 2014-06-23 HISTORY — DX: Hyperesthesia: R20.3

## 2014-06-23 HISTORY — DX: Other specified sprain of unspecified wrist, initial encounter: S63.599A

## 2014-06-23 LAB — POCT HEMOGLOBIN-HEMACUE: Hemoglobin: 14.2 g/dL (ref 12.0–15.0)

## 2014-06-23 SURGERY — ARTHROSCOPY, WRIST
Anesthesia: General | Laterality: Right

## 2014-06-23 MED ORDER — BUPIVACAINE-EPINEPHRINE (PF) 0.5% -1:200000 IJ SOLN
INTRAMUSCULAR | Status: AC
  Filled 2014-06-23: qty 120

## 2014-06-23 MED ORDER — MIDAZOLAM HCL 5 MG/5ML IJ SOLN
INTRAMUSCULAR | Status: DC | PRN
  Administered 2014-06-23: 2 mg via INTRAVENOUS

## 2014-06-23 MED ORDER — DEXAMETHASONE SODIUM PHOSPHATE 10 MG/ML IJ SOLN
INTRAMUSCULAR | Status: DC | PRN
  Administered 2014-06-23: 10 mg via INTRAVENOUS

## 2014-06-23 MED ORDER — PROPOFOL 10 MG/ML IV BOLUS
INTRAVENOUS | Status: DC | PRN
  Administered 2014-06-23: 200 mg via INTRAVENOUS

## 2014-06-23 MED ORDER — HYDROMORPHONE HCL 1 MG/ML IJ SOLN: 0.2500 mg | INTRAMUSCULAR | Status: DC | PRN

## 2014-06-23 MED ORDER — MIDAZOLAM HCL 2 MG/2ML IJ SOLN
1.0000 mg | INTRAMUSCULAR | Status: DC | PRN
  Administered 2014-06-23: 2 mg via INTRAVENOUS

## 2014-06-23 MED ORDER — HYDROMORPHONE HCL 1 MG/ML IJ SOLN
0.2500 mg | INTRAMUSCULAR | Status: DC | PRN
  Administered 2014-06-23 (×2): 0.5 mg via INTRAVENOUS

## 2014-06-23 MED ORDER — LACTATED RINGERS IV SOLN
INTRAVENOUS | Status: DC
Start: 2014-06-23 — End: 2014-06-23
  Administered 2014-06-23: 07:00:00 via INTRAVENOUS

## 2014-06-23 MED ORDER — SUCCINYLCHOLINE CHLORIDE 20 MG/ML IJ SOLN
INTRAMUSCULAR | Status: AC
  Filled 2014-06-23: qty 1

## 2014-06-23 MED ORDER — MIDAZOLAM HCL 2 MG/2ML IJ SOLN
INTRAMUSCULAR | Status: AC
  Filled 2014-06-23: qty 2

## 2014-06-23 MED ORDER — LIDOCAINE HCL (CARDIAC) 20 MG/ML IV SOLN
INTRAVENOUS | Status: DC | PRN
  Administered 2014-06-23: 30 mg via INTRAVENOUS

## 2014-06-23 MED ORDER — OXYCODONE-ACETAMINOPHEN 5-325 MG PO TABS: 1.0000 | ORAL_TABLET | ORAL | Status: DC | PRN

## 2014-06-23 MED ORDER — FENTANYL CITRATE 0.05 MG/ML IJ SOLN
INTRAMUSCULAR | Status: DC | PRN
  Administered 2014-06-23: 50 ug via INTRAVENOUS
  Administered 2014-06-23 (×2): 25 ug via INTRAVENOUS
  Administered 2014-06-23 (×2): 50 ug via INTRAVENOUS

## 2014-06-23 MED ORDER — FENTANYL CITRATE 0.05 MG/ML IJ SOLN
INTRAMUSCULAR | Status: AC
  Filled 2014-06-23: qty 2

## 2014-06-23 MED ORDER — LACTATED RINGERS IV SOLN
INTRAVENOUS | Status: DC
Start: 2014-06-23 — End: 2014-06-23
  Administered 2014-06-23 (×2): via INTRAVENOUS

## 2014-06-23 MED ORDER — STERILE WATER FOR IRRIGATION IR SOLN
Status: DC | PRN
  Administered 2014-06-23: 100 mL

## 2014-06-23 MED ORDER — ONDANSETRON HCL 4 MG/2ML IJ SOLN
INTRAMUSCULAR | Status: DC | PRN
  Administered 2014-06-23: 4 mg via INTRAVENOUS

## 2014-06-23 MED ORDER — CLINDAMYCIN PHOSPHATE 900 MG/50ML IV SOLN
INTRAVENOUS | Status: AC
  Filled 2014-06-23: qty 50

## 2014-06-23 MED ORDER — PROPOFOL 10 MG/ML IV EMUL
INTRAVENOUS | Status: AC
  Filled 2014-06-23: qty 50

## 2014-06-23 MED ORDER — HYDROMORPHONE HCL 1 MG/ML IJ SOLN
INTRAMUSCULAR | Status: AC
  Filled 2014-06-23: qty 1

## 2014-06-23 MED ORDER — OXYCODONE HCL 5 MG PO TABS
ORAL_TABLET | ORAL | Status: AC
Start: 2014-06-23 — End: 2014-06-23
  Filled 2014-06-23: qty 1

## 2014-06-23 MED ORDER — MIDAZOLAM HCL 2 MG/ML PO SYRP: 12.0000 mg | ORAL_SOLUTION | Freq: Once | ORAL | Status: DC | PRN

## 2014-06-23 MED ORDER — BUPIVACAINE-EPINEPHRINE 0.5% -1:200000 IJ SOLN
INTRAMUSCULAR | Status: DC | PRN
  Administered 2014-06-23: 20 mL

## 2014-06-23 MED ORDER — OXYCODONE HCL 5 MG PO TABS
5.0000 mg | ORAL_TABLET | Freq: Once | ORAL | Status: AC
Start: 2014-06-23 — End: 2014-06-23
  Administered 2014-06-23: 5 mg via ORAL

## 2014-06-23 MED ORDER — FENTANYL CITRATE 0.05 MG/ML IJ SOLN
50.0000 ug | INTRAMUSCULAR | Status: DC | PRN
  Administered 2014-06-23: 100 ug via INTRAVENOUS

## 2014-06-23 MED ORDER — FENTANYL CITRATE 0.05 MG/ML IJ SOLN
INTRAMUSCULAR | Status: AC
  Filled 2014-06-23: qty 6

## 2014-06-23 MED ORDER — CLINDAMYCIN PHOSPHATE 900 MG/50ML IV SOLN
900.0000 mg | INTRAVENOUS | Status: AC
  Administered 2014-06-23: 900 mg via INTRAVENOUS

## 2014-06-23 MED ORDER — SODIUM CHLORIDE 0.9 % IR SOLN
Status: DC | PRN
  Administered 2014-06-23: 3000 mL

## 2014-06-23 SURGICAL SUPPLY — 100 items
APL SKNCLS STERI-STRIP NONHPOA (GAUZE/BANDAGES/DRESSINGS) ×1
BANDAGE COBAN STERILE 2 (GAUZE/BANDAGES/DRESSINGS) IMPLANT
BANDAGE GAUZE 4  KLING STR (GAUZE/BANDAGES/DRESSINGS) ×1 IMPLANT
BENZOIN TINCTURE PRP APPL 2/3 (GAUZE/BANDAGES/DRESSINGS) ×1 IMPLANT
BIT DRILL 2.8X5 QR DISP (BIT) ×1 IMPLANT
BIT DRILL QUICK RELEASE 3.5MM (BIT) IMPLANT
BLADE AVERAGE 25X9 (BLADE) IMPLANT
BLADE MINI RND TIP GREEN BEAV (BLADE) IMPLANT
BLADE SAW OSTEOTOMY (BLADE) ×1 IMPLANT
BLADE SURG 15 STRL LF DISP TIS (BLADE) ×1 IMPLANT
BLADE SURG 15 STRL SS (BLADE) ×4
BNDG CMPR 9X4 STRL LF SNTH (GAUZE/BANDAGES/DRESSINGS) ×1
BNDG COHESIVE 4X5 TAN STRL (GAUZE/BANDAGES/DRESSINGS) ×2 IMPLANT
BNDG ESMARK 4X9 LF (GAUZE/BANDAGES/DRESSINGS) ×2 IMPLANT
BNDG GAUZE ELAST 4 BULKY (GAUZE/BANDAGES/DRESSINGS) ×4 IMPLANT
BRUSH SCRUB EZ PLAIN DRY (MISCELLANEOUS) IMPLANT
BUR CUDA 2.9 (BURR) ×1 IMPLANT
BUR EGG 3PK/BX (BURR) IMPLANT
BUR FULL RADIUS 2.0 (BURR) IMPLANT
BUR FULL RADIUS 2.9 (BURR) IMPLANT
BUR GATOR 2.9 (BURR) IMPLANT
CANISTER LINER 1300 C W/ELBOW (MISCELLANEOUS) IMPLANT
CANISTER SUCT 1200ML W/VALVE (MISCELLANEOUS) ×1 IMPLANT
CANISTER SUCT 3000ML (MISCELLANEOUS) ×1 IMPLANT
CHLORAPREP W/TINT 26ML (MISCELLANEOUS) ×2 IMPLANT
CORDS BIPOLAR (ELECTRODE) ×2 IMPLANT
COVER BACK TABLE 60X90IN (DRAPES) ×2 IMPLANT
COVER MAYO STAND STRL (DRAPES) ×2 IMPLANT
CUFF TOURNIQUET SINGLE 18IN (TOURNIQUET CUFF) ×2 IMPLANT
CUFF TOURNIQUET SINGLE 24IN (TOURNIQUET CUFF) IMPLANT
DECANTER SPIKE VIAL GLASS SM (MISCELLANEOUS) ×1 IMPLANT
DRAPE C-ARM 42X72 X-RAY (DRAPES) ×2 IMPLANT
DRAPE EXTREMITY T 121X128X90 (DRAPE) ×2 IMPLANT
DRAPE OEC MINIVIEW 54X84 (DRAPES) ×1 IMPLANT
DRAPE SURG 17X23 STRL (DRAPES) ×2 IMPLANT
DRILL QUICK RELEASE 3.5MM (BIT) ×2
DRSG ADAPTIC 3X8 NADH LF (GAUZE/BANDAGES/DRESSINGS) ×2 IMPLANT
DRSG EMULSION OIL 3X3 NADH (GAUZE/BANDAGES/DRESSINGS) ×2 IMPLANT
DRSG TEGADERM 4X4.75 (GAUZE/BANDAGES/DRESSINGS) IMPLANT
ELECT REM PT RETURN 9FT ADLT (ELECTROSURGICAL) ×2
ELECTRODE REM PT RTRN 9FT ADLT (ELECTROSURGICAL) ×1 IMPLANT
GAUZE SPONGE 4X4 12PLY STRL (GAUZE/BANDAGES/DRESSINGS) ×2 IMPLANT
GLOVE BIO SURGEON STRL SZ7.5 (GLOVE) ×2 IMPLANT
GLOVE BIOGEL PI IND STRL 7.0 (GLOVE) ×1 IMPLANT
GLOVE BIOGEL PI IND STRL 8 (GLOVE) ×1 IMPLANT
GLOVE BIOGEL PI INDICATOR 7.0 (GLOVE) ×1
GLOVE BIOGEL PI INDICATOR 8 (GLOVE) ×1
GLOVE ECLIPSE 6.5 STRL STRAW (GLOVE) ×2 IMPLANT
GOWN STRL REUS W/ TWL LRG LVL3 (GOWN DISPOSABLE) ×1 IMPLANT
GOWN STRL REUS W/TWL LRG LVL3 (GOWN DISPOSABLE) ×2
GOWN STRL REUS W/TWL XL LVL3 (GOWN DISPOSABLE) ×2 IMPLANT
GUIDEWIRE ORTHO 0.054X6 (WIRE) ×2 IMPLANT
LIQUID BAND (GAUZE/BANDAGES/DRESSINGS) IMPLANT
MANIFOLD NEPTUNE II (INSTRUMENTS) IMPLANT
NDL HYPO 25X1 1.5 SAFETY (NEEDLE) IMPLANT
NEEDLE HYPO 22GX1.5 SAFETY (NEEDLE) ×3 IMPLANT
NEEDLE HYPO 25X1 1.5 SAFETY (NEEDLE) ×2 IMPLANT
NS IRRIG 1000ML POUR BTL (IV SOLUTION) ×1 IMPLANT
PACK BASIN DAY SURGERY FS (CUSTOM PROCEDURE TRAY) ×2 IMPLANT
PADDING CAST ABS 4INX4YD NS (CAST SUPPLIES) ×1
PADDING CAST ABS COTTON 4X4 ST (CAST SUPPLIES) ×1 IMPLANT
PENCIL BUTTON HOLSTER BLD 10FT (ELECTRODE) ×2 IMPLANT
PLATE ULNAR SHORTENING (Plate) ×1 IMPLANT
RETRIEVER SUT HEWSON (MISCELLANEOUS) IMPLANT
RUBBERBAND STERILE (MISCELLANEOUS) IMPLANT
SCREW HEXALOBE NON-LOCK 3.5X14 (Screw) ×1 IMPLANT
SCREW LOCK 12X3.5X HEXALOBE (Screw) IMPLANT
SCREW LOCKING 3.5X10MM (Screw) ×1 IMPLANT
SCREW LOCKING 3.5X12 (Screw) ×2 IMPLANT
SCREW NON LOCK 3.5X10MM (Screw) ×3 IMPLANT
SCREW NONLOCK HEX 3.5X12 (Screw) ×1 IMPLANT
SET SM JOINT TUBING/CANN (CANNULA) ×2 IMPLANT
SHEET MEDIUM DRAPE 40X70 STRL (DRAPES) IMPLANT
SLEEVE SCD COMPRESS KNEE MED (MISCELLANEOUS) ×2 IMPLANT
SLING ARM LRG ADULT FOAM STRAP (SOFTGOODS) IMPLANT
SLING ARM MED ADULT FOAM STRAP (SOFTGOODS) IMPLANT
SLING ARM SM FOAM STRAP (SOFTGOODS) IMPLANT
SLING ARM XL FOAM STRAP (SOFTGOODS) IMPLANT
SPLINT PLASTER CAST XFAST 4X15 (CAST SUPPLIES) IMPLANT
SPLINT PLASTER XTRA FAST SET 4 (CAST SUPPLIES)
SPONGE GAUZE 4X4 12PLY STER LF (GAUZE/BANDAGES/DRESSINGS) ×1 IMPLANT
STOCKINETTE 6  STRL (DRAPES) ×1
STOCKINETTE 6 STRL (DRAPES) ×1 IMPLANT
STRIP CLOSURE SKIN 1/2X4 (GAUZE/BANDAGES/DRESSINGS) ×1 IMPLANT
SUCTION FRAZIER TIP 10 FR DISP (SUCTIONS) IMPLANT
SUT ETHIBOND 2 OS 4 DA (SUTURE) IMPLANT
SUT VIC AB 2-0 CT3 27 (SUTURE) ×1 IMPLANT
SUT VICRYL 4-0 PS2 18IN ABS (SUTURE) ×1 IMPLANT
SUT VICRYL RAPIDE 4-0 (SUTURE) IMPLANT
SUT VICRYL RAPIDE 4/0 PS 2 (SUTURE) ×2 IMPLANT
SYR BULB 3OZ (MISCELLANEOUS) ×2 IMPLANT
SYR CONTROL 10ML LL (SYRINGE) ×2 IMPLANT
SYRINGE 10CC LL (SYRINGE) ×1 IMPLANT
TOWEL OR 17X24 6PK STRL BLUE (TOWEL DISPOSABLE) ×2 IMPLANT
TOWEL OR NON WOVEN STRL DISP B (DISPOSABLE) ×2 IMPLANT
TRAP DIGIT (INSTRUMENTS) ×1 IMPLANT
TRAP FINGER LRG (INSTRUMENTS) IMPLANT
TUBE CONNECTING 20X1/4 (TUBING) ×2 IMPLANT
UNDERPAD 30X30 INCONTINENT (UNDERPADS AND DIAPERS) ×2 IMPLANT
WAND 1.5 MICROBLATOR (SURGICAL WAND) ×1 IMPLANT

## 2014-06-23 NOTE — Interval H&P Note (Signed)
History and Physical Interval Note:  06/23/2014 7:38 AM  Colbert Coyer  has presented today for surgery, with the diagnosis of RIGHT WRIST TRIANGULAR FIBROCARTILAGE COMPLEX TEAR  The various methods of treatment have been discussed with the patient and family. After consideration of risks, benefits and other options for treatment, the patient has consented to  Procedure(s): ARTHROSCOPY WRIST AND ULNA SHORTENING OSTEOTOMY (Right) ULNAR SHORTENING (Right) as a surgical intervention .  The patient's history has been reviewed, patient examined, no change in status, stable for surgery.  I have reviewed the patient's chart and labs.  Questions were answered to the patient's satisfaction.     Gilberto Stanforth A.

## 2014-06-23 NOTE — Discharge Instructions (Addendum)
Discharge Instructions   You have a dressing on your right hand, and a sling to help with elevation. Move your fingers as much as possible, making a full fist and fully opening the fist. Elevate your hand to reduce pain & swelling of the digits.  Ice over the operative site may be helpful to reduce pain & swelling.  DO NOT USE HEAT. Pain medicine has been prescribed for you.  Use your medicine as needed over the first 48 hours, and then you can begin to taper your use.  You may use Tylenol in place of your prescribed pain medication, but not IN ADDITION to it. Leave the dressing in place until you return to our office.  You may shower, but keep the bandage clean & dry.  You may drive a car when you are off of prescription pain medications and can safely control your vehicle with both hands.   Please call [phone] during normal business hours or [phone] after hours for any problems. Including the following:  - excessive redness of the incisions - drainage for more than 4 days - fever of more than 101.5 F  *Please note that pain medications will not be refilled after hours or on weekends.   Regional Anesthesia Blocks  1. Numbness or the inability to move the "blocked" extremity may last from 3-48 hours after placement. The length of time depends on the medication injected and your individual response to the medication. If the numbness is not going away after 48 hours, call your surgeon.  2. The extremity that is blocked will need to be protected until the numbness is gone and the  Strength has returned. Because you cannot feel it, you will need to take extra care to avoid injury. Because it may be weak, you may have difficulty moving it or using it. You may not know what position it is in without looking at it while the block is in effect.  3. For blocks in the legs and feet, returning to weight bearing and walking needs to be done carefully. You will need to wait until the  numbness is entirely gone and the strength has returned. You should be able to move your leg and foot normally before you try and bear weight or walk. You will need someone to be with you when you first try to ensure you do not fall and possibly risk injury.  4. Bruising and tenderness at the needle site are common side effects and will resolve in a few days.  5. Persistent numbness or new problems with movement should be communicated to the surgeon or the [facility] [phone] [place] [phone]).   Post Anesthesia Home Care Instructions  Activity: Get plenty of rest for the remainder of the day. A responsible adult should stay with you for 24 hours following the procedure.  For the next 24 hours, DO NOT: -Drive a car -Paediatric nurse -Drink alcoholic beverages -Take any medication unless instructed by your physician -Make any legal decisions or sign important papers.  Meals: Start with liquid foods such as gelatin or soup. Progress to regular foods as tolerated. Avoid greasy, spicy, heavy foods. If nausea and/or vomiting occur, drink only clear liquids until the nausea and/or vomiting subsides. Call your physician if vomiting continues.  Special Instructions/Symptoms: Your throat may feel dry or sore from the anesthesia or the breathing tube placed in your throat during surgery. If this causes discomfort, gargle with warm salt water. The discomfort should  disappear within 24 hours.

## 2014-06-23 NOTE — Anesthesia Procedure Notes (Addendum)
Procedure Name: LMA Insertion Date/Time: 06/23/2014 7:44 AM Performed by: BLOCKER, TIMOTHY Pre-anesthesia Checklist: Patient identified, Emergency Drugs available, Suction available and Patient being monitored Patient Re-evaluated:Patient Re-evaluated prior to inductionOxygen Delivery Method: Circle System Utilized Preoxygenation: Pre-oxygenation with 100% oxygen Intubation Type: IV induction Ventilation: Mask ventilation without difficulty LMA: LMA inserted LMA Size: 4.0 Number of attempts: 1 Airway Equipment and Method: bite block Placement Confirmation: positive ETCO2 Tube secured with: Tape Dental Injury: Teeth and Oropharynx as per pre-operative assessment    Anesthesia Regional Block:  Supraclavicular block  Pre-Anesthetic Checklist: ,, timeout performed, Correct Patient, Correct Site, Correct Laterality, Correct Procedure, Correct Position, site marked, Risks and benefits discussed, pre-op evaluation, post-op pain management  Laterality: Right  Prep: chloraprep       Needles:   Needle Type: Stimulator Needle - 40          Additional Needles:  Procedures: Doppler guided and ultrasound guided (picture in chart) Supraclavicular block  Nerve Stimulator or Paresthesia:  Response: 0.5 mA,   Additional Responses:   Narrative:  Start time: 06/23/2014 7:00 AM End time: 06/23/2014 7:12 AM Injection made incrementally with aspirations every 5 mL.  Performed by: Personally

## 2014-06-23 NOTE — Anesthesia Preprocedure Evaluation (Addendum)
Anesthesia Evaluation  Patient identified by MRN, date of birth, ID band Patient awake    Reviewed: Allergy & Precautions, NPO status   Airway Mallampati: II  TM Distance: >3 FB Neck ROM: Full    Dental   Pulmonary Current Smoker,  breath sounds clear to auscultation        Cardiovascular negative cardio ROS  Rhythm:Regular Rate:Normal     Neuro/Psych    GI/Hepatic negative GI ROS, Neg liver ROS,   Endo/Other  negative endocrine ROS  Renal/GU negative Renal ROS     Musculoskeletal   Abdominal   Peds  Hematology   Anesthesia Other Findings   Reproductive/Obstetrics                          Anesthesia Physical Anesthesia Plan  ASA: II  Anesthesia Plan: General   Post-op Pain Management:    Induction: Intravenous  Airway Management Planned:   Additional Equipment:   Intra-op Plan:   Post-operative Plan: Extubation in OR  Informed Consent: I have reviewed the patients History and Physical, chart, labs and discussed the procedure including the risks, benefits and alternatives for the proposed anesthesia with the patient or authorized representative who has indicated his/her understanding and acceptance.   Dental advisory given  Plan Discussed with: Anesthesiologist and CRNA  Anesthesia Plan Comments:         Anesthesia Quick Evaluation

## 2014-06-23 NOTE — Addendum Note (Signed)
Addendum  created 06/23/14 1114 by Finis Bud, MD   Modules edited: Anesthesia Blocks and Procedures, Clinical Notes   Clinical Notes:  File: [id number]; File: [id number]

## 2014-06-23 NOTE — Anesthesia Postprocedure Evaluation (Signed)
  Anesthesia Post-op Note  Patient: Lindsey Martin  Procedure(s) Performed: Procedure(s): ARTHROSCOPY WRIST AND ULNA SHORTENING OSTEOTOMY (Right) ULNAR SHORTENING (Right)  Patient Location: PACU  Anesthesia Type:General  Level of Consciousness: awake  Airway and Oxygen Therapy: Patient Spontanous Breathing  Post-op Pain: mild  Post-op Assessment: Post-op Vital signs reviewed  Post-op Vital Signs: Reviewed  Last Vitals:  Filed Vitals:   06/23/14 1015  BP: 131/77  Pulse: 70  Temp:   Resp: 19    Complications: No apparent anesthesia complications

## 2014-06-23 NOTE — Transfer of Care (Signed)
Immediate Anesthesia Transfer of Care Note  Patient: Lindsey Martin  Procedure(s) Performed: Procedure(s): ARTHROSCOPY WRIST AND ULNA SHORTENING OSTEOTOMY (Right) ULNAR SHORTENING (Right)  Patient Location: PACU  Anesthesia Type:GA combined with regional for post-op pain  Level of Consciousness: awake, alert , oriented and patient cooperative  Airway & Oxygen Therapy: Patient Spontanous Breathing and Patient connected to face mask oxygen  Post-op Assessment: Report given to PACU RN and Post -op Vital signs reviewed and stable  Post vital signs: Reviewed and stable  Complications: No apparent anesthesia complications

## 2014-06-23 NOTE — Op Note (Signed)
06/23/2014  7:38 AM  PATIENT:  Lindsey Martin  33 y.o. 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 Grandville Silos, MD  PHYSICIAN ASSISTANT: Morley Kos, OPA-C  ANESTHESIA:  regional and general  SPECIMENS:  None  DRAINS:   None  PREOPERATIVE INDICATIONS:  Lindsey Martin is a  33 y.o. female with right ulnar wrist pain, with ulnar positive variance and TFCC central tear  The risks benefits and alternatives were discussed with the patient preoperatively including but not limited to the risks of infection, bleeding, nerve injury, cardiopulmonary complications, the need for revision surgery, among others, and the patient verbalized understanding and consented to proceed.  OPERATIVE IMPLANTS: Acumed ulnar shortening osteotomy plate/screws  OPERATIVE PROCEDURE:  After receiving prophylactic antibiotics and a regional block, the patient was escorted to the operative theatre and placed in a supine position. General anesthesia was administered  A surgical "time-out" was performed during which the planned procedure, proposed operative site, and the correct patient identity were compared to the operative consent and agreement confirmed by the circulating nurse according to current facility policy.  Following application of a tourniquet to the operative extremity, the exposed skin was prepped with Chloraprep and draped in the usual sterile fashion.  The limb was exsanguinated with an Esmarch bandage and the tourniquet inflated to approximately 127mmHg higher than systolic BP.  The limb was positioned in the Linvatec traction tower, 12-15 pounds of traction applied and the joint was insufflated with saline. A standard 3-4 portal was established, incising the skin sharply and dissecting with a hemostat spreading dissection down to the capsule where the hemostat was used to  create a perforation followed by placement of the cannula with the blunt obturator. Visualization was good. The chondral surfaces of the scaphoid lunate and triquetrum were fairly unremarkable. SL and LT ligaments were intact. Palmar ligaments were intact. TFCC was found to have a central perforation with a flap that was billowing. A 6R portal was established after needle localization. Suction shaver, suction punch, and micro-ablator were all used to debride the TFCC tear back to stable palmar and dorsal margins. The chondral head of the ulna was identified and in good condition, with some mild chondromalacia without clefting seen on the corner adjacent to the perforation. Once satisfied with the arthroscopic portion, the limb was taken out of traction and a linear incision made over the ulnar subcutaneous border of the ulna. The deep fascia was split in the dorsal and palmar muscles were lifted extra periosteally off of the ulna. The Acumed plate was applied against the volar surface of the ulna and provisionally secured with the distal screw and the threaded cage. The other 2 distal screws were drilled but not filled. The jig was applied and secured and the 2 cuts made at 1 and 3.5, with irrigation applied to prevent thermal necrosis. This allowed for a 3.5 mm potential shortening. Mobilization of the distal piece was difficult and required a little bit of more distal dissection about the ulna, including partial release of the interosseous membrane. Once this was done, the osteotomy site reapproximated nicely and was secured with a lag screw. The remainder of the screws were then drilled and filled and tightened, 2 of which were locking screws. Final images were then obtained revealing anatomic reapproximation of the osteotomy. The wound is irrigated and the deep fascia closed with running  2-0 Vicryl suture followed by deflation of the tourniquet. Some additional hemostasis was obtained and the skin was closed  with 2-0 Vicryl deep dermal buried sutures and a running 4-0 Vicryl repeat subcuticular suture followed by benzoin and Steri-Strips. The small portals for wrist arthroscopy were closed with a simple horizontal mattress suture of 4-0 Vicryl Rapide. After some Marcaine with epinephrine was instilled in and around the incisions to help with postoperative pain control upon recommendation of the nurse anesthetist gauging response to pain from the block. A bulky dressing was applied. She was awakened and taken to the recovery room in stable condition, breathing spontaneously  DISPOSITION: She'll be discharged home today with typical instructions returning in 10-15 days for reevaluation with x-rays of the right forearm out of the splint and likely transitioning to a longer version of the short-arm removable splint.

## 2014-06-23 NOTE — Progress Notes (Signed)
Assisted Dr. Edwards with right, ultrasound guided, interscalene  block. Side rails up, monitors on throughout procedure. See vital signs in flow sheet. Tolerated Procedure well. 

## 2014-06-27 ENCOUNTER — Encounter (HOSPITAL_BASED_OUTPATIENT_CLINIC_OR_DEPARTMENT_OTHER): Payer: Self-pay | Admitting: Orthopedic Surgery

## 2014-09-22 ENCOUNTER — Emergency Department (INDEPENDENT_AMBULATORY_CARE_PROVIDER_SITE_OTHER)
Admission: EM | Admit: 2014-09-22 | Discharge: 2014-09-22 | Disposition: A | Discharge status: Home / Self Care | Payer: Self-pay | Source: Home / Self Care | Attending: Family Medicine | Admitting: Family Medicine

## 2014-09-22 ENCOUNTER — Encounter: Payer: Self-pay | Admitting: *Deleted

## 2014-09-22 DIAGNOSIS — J029 Acute pharyngitis, unspecified: Secondary | ICD-10-CM

## 2014-09-22 DIAGNOSIS — J069 Acute upper respiratory infection, unspecified: Secondary | ICD-10-CM

## 2014-09-22 LAB — POCT RAPID STREP A (OFFICE): Rapid Strep A Screen: NEGATIVE

## 2014-09-22 MED ORDER — DOXYCYCLINE HYCLATE 100 MG PO CAPS: 100.0000 mg | ORAL_CAPSULE | Freq: Two times a day (BID) | ORAL | Status: DC

## 2014-09-22 MED ORDER — BENZONATATE 200 MG PO CAPS: 200.0000 mg | ORAL_CAPSULE | Freq: Every day | ORAL | Status: DC

## 2014-09-22 NOTE — Discharge Instructions (Signed)
Take plain guaifenesin 1200mg  (Mucinex) twice daily, with plenty of water, for cough and congestion.  May add Pseudoephedrine for sinus congestion.  Get adequate rest.   May use Afrin nasal spray (or generic oxymetazoline) twice daily for about 5 days.  Also recommend using saline nasal spray several times daily and saline nasal irrigation (AYR is a common brand) Try warm salt water gargles for sore throat.  Stop all antihistamines for now, and other non-prescription cough/cold preparations. May take Ibuprofen 200mg , 4 tabs every 8 hours with food for chest/sternum discomfort, headache, etc.   Follow-up with family doctor if not improving about one week.

## 2014-09-22 NOTE — ED Notes (Signed)
Lindsey Martin c/o 6 days of ear pain, sore throat, cough and 2 days of sweats. Taken Mucinex otc.

## 2014-09-22 NOTE — ED Provider Notes (Signed)
CSN: [id number]     Arrival date & time 09/22/14  1340 History   First MD Initiated Contact with Patient 09/22/14 1403     Chief Complaint  Patient presents with  . Sore Throat  . Otalgia      HPI Comments: Patient complains of six day history of typical cold-like symptoms including mild sore throat, sinus congestion, headache, fatigue, and cough.  She has had chills/sweats.  The cough has become non-productive and she often coughs until she gags.  Her last Tdap was about 4.5 years ago.  The history is provided by the patient.    Past Medical History  Diagnosis Date  . 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 Psoriasis     elbows, knees, left ankle  . Sensitive skin   . Teeth grinding   . TMJ locking     right jaw   Past Surgical History  Procedure Laterality Date  . Wisdom tooth extraction    . Cyst excision Right 11/19/2000    labium majus  . Wrist arthroscopy Right 06/23/2014    Procedure: ARTHROSCOPY WRIST AND ULNA SHORTENING OSTEOTOMY;  Surgeon: Jolyn Nap, MD;  Location: [place];  Service: Orthopedics;  Laterality: Right;  . Ulna osteotomy Right 06/23/2014    Procedure: ULNAR SHORTENING;  Surgeon: Jolyn Nap, MD;  Location: [place];  Service: Orthopedics;  Laterality: Right;   Family History  Problem Relation Age of Onset  . Heart disease Mother     CABG   History  Substance Use Topics  . Smoking status: Former Smoker -- 0.33 packs/day for 18 years    Types: Cigarettes  . Smokeless tobacco: Current User     Comment: Vape  . Alcohol Use: No   OB History    Gravida Para Term Preterm AB TAB SAB Ectopic Multiple Living   2 1 1  1   1  1      Review of Systems + sore throat + cough No pleuritic pain + wheezing + nasal congestion + post-nasal drainage No sinus pain/pressure No itchy/red eyes No earache No hemoptysis + SOB with activity No fever, + chills/sweats No nausea No  vomiting No abdominal pain No diarrhea No urinary symptoms No skin rash + fatigue + myalgias + headache Used OTC meds without relief  Allergies  Zithromax  Home Medications   Prior to Admission medications   Medication Sig Start Date End Date Taking? Authorizing Provider  benzonatate (TESSALON) 200 MG capsule Take 1 capsule (200 mg total) by mouth at bedtime. Take as needed for cough 09/22/14   Kandra Nicolas, MD  clobetasol ointment (TEMOVATE) 0.09 % Apply 1 application topically 3 (three) times daily.    Historical Provider, MD  doxycycline (VIBRAMYCIN) 100 MG capsule Take 1 capsule (100 mg total) by mouth 2 (two) times daily. Take with food. 09/22/14   Kandra Nicolas, MD  fluocinonide (LIDEX) 0.05 % external solution Apply 1 application topically 2 (two) times daily.    Historical Provider, MD  meloxicam (MOBIC) 7.5 MG tablet Take 7.5 mg by mouth daily.    Historical Provider, MD   BP 116/77 mmHg  Pulse 77  Temp(Src) 98.3 F (36.8 C) (Oral)  Resp 16  Wt 127 lb (57.607 kg)  SpO2 99%  LMP 09/22/2014 Physical Exam Nursing notes and Vital Signs reviewed. Appearance:  Patient appears stated age, and in no acute distress Eyes:  Pupils are equal, round, and reactive  to light and accomodation.  Extraocular movement is intact.  Conjunctivae are not inflamed  Ears:  Canals normal.  Tympanic membranes normal.  Nose:  Mildly congested turbinates.  No sinus tenderness.  Pharynx:   Minimal erythema Neck:  Supple.  Tender enlarged posterior nodes are palpated bilaterally  Lungs:  Clear to auscultation.  Breath sounds are equal.  Chest:  Distinct tenderness to palpation over the mid-sternum.  Heart:  Regular rate and rhythm without murmurs, rubs, or gallops.  Abdomen:  Nontender without masses or hepatosplenomegaly.  Bowel sounds are present.  No CVA or flank tenderness.  Extremities:  No edema.  No calf tenderness Skin:  No rash present.   ED Course  Procedures  None    Labs  Reviewed  POCT RAPID STREP A (OFFICE) negative      MDM   1. Acute upper respiratory infection    Begin doxycycline 100mg  BID for atypical coverage.  Prescription written for Benzonatate [facility]) to take at bedtime for night-time cough.  Take plain guaifenesin 1200mg  (Mucinex) twice daily, with plenty of water, for cough and congestion.  May add Pseudoephedrine for sinus congestion.  Get adequate rest.   May use Afrin nasal spray (or generic oxymetazoline) twice daily for about 5 days.  Also recommend using saline nasal spray several times daily and saline nasal irrigation (AYR is a common brand) Try warm salt water gargles for sore throat.  Stop all antihistamines for now, and other non-prescription cough/cold preparations. May take Ibuprofen 200mg , 4 tabs every 8 hours with food for chest/sternum discomfort, headache, etc.   Follow-up with family doctor if not improving about one week.    Kandra Nicolas, MD 09/27/14 [phone]

## 2014-11-01 ENCOUNTER — Encounter: Payer: Self-pay | Admitting: Emergency Medicine

## 2014-11-01 ENCOUNTER — Emergency Department
Admission: EM | Admit: 2014-11-01 | Discharge: 2014-11-01 | Disposition: A | Discharge status: Home / Self Care | Payer: Self-pay | Source: Home / Self Care | Attending: Family Medicine | Admitting: Family Medicine

## 2014-11-01 DIAGNOSIS — K088 Other specified disorders of teeth and supporting structures: Secondary | ICD-10-CM

## 2014-11-01 DIAGNOSIS — K0889 Other specified disorders of teeth and supporting structures: Secondary | ICD-10-CM

## 2014-11-01 MED ORDER — HYDROCODONE-ACETAMINOPHEN 5-325 MG PO TABS: 1.0000 | ORAL_TABLET | ORAL | Status: DC | PRN

## 2014-11-01 MED ORDER — AMOXICILLIN 875 MG PO TABS: 875.0000 mg | ORAL_TABLET | Freq: Two times a day (BID) | ORAL | Status: DC

## 2014-11-01 NOTE — Discharge Instructions (Signed)
May take Ibuprofen 200mg , 4 tabs every 8 hours with food as needed. If symptoms become significantly worse during the night or over the weekend, proceed to the local emergency room.    Dental Pain A tooth ache may be caused by cavities (tooth decay). Cavities expose the nerve of the tooth to air and hot or cold temperatures. It may come from an infection or abscess (also called a boil or furuncle) around your tooth. It is also often caused by dental caries (tooth decay). This causes the pain you are having. DIAGNOSIS  Your caregiver can diagnose this problem by exam. TREATMENT   If caused by an infection, it may be treated with medications which kill germs (antibiotics) and pain medications as prescribed by your caregiver. Take medications as directed.  Only take over-the-counter or prescription medicines for pain, discomfort, or fever as directed by your caregiver.  Whether the tooth ache today is caused by infection or dental disease, you should see your dentist as soon as possible for further care. SEEK MEDICAL CARE IF: The exam and treatment you received today has been provided on an emergency basis only. This is not a substitute for complete medical or dental care. If your problem worsens or new problems (symptoms) appear, and you are unable to meet with your dentist, call or return to this location. SEEK IMMEDIATE MEDICAL CARE IF:   You have a fever.  You develop redness and swelling of your face, jaw, or neck.  You are unable to open your mouth.  You have severe pain uncontrolled by pain medicine. MAKE SURE YOU:   Understand these instructions.  Will watch your condition.  Will get help right away if you are not doing well or get worse. Document Released: 07/21/2005 Document Revised: 10/13/2011 Document Reviewed: 03/08/2008 [facility] Patient Information 2015 [place], [place]. This information is not intended to replace advice given to you by your health care provider. Make sure  you discuss any questions you have with your health care provider.

## 2014-11-01 NOTE — ED Notes (Signed)
Reports pain in area of right ear, jaw, neck; cannot tell if it is dental related, but her dentist is out of office for 5 days. She feels there is some swelling in jaw area now. Symptoms began 2 days ago. Took ibuprofen 200mg  this morning.

## 2014-11-01 NOTE — ED Provider Notes (Signed)
CSN: [id number]     Arrival date & time 11/01/14  1211 History   First MD Initiated Contact with Patient 11/01/14 1358     Chief Complaint  Patient presents with  . Otalgia      HPI Comments: Patient complains of two day history of vague pain in her right ear, jaw, and neck.  She has had pain in a right lower molar tooth.  No fevers, chills, and sweats.  She has noted a bad taste in her mouth.  Patient is a 34 y.o. female presenting with tooth pain. The history is provided by the patient.  Dental Pain Location:  Lower Lower teeth location:  31/RL 2nd molar Quality:  Aching Severity:  Mild Onset quality:  Gradual Duration:  2 days Timing:  Constant Progression:  Worsening Chronicity:  New Previous work-up:  Filled cavity Relieved by:  Nothing Worsened by:  Pressure Ineffective treatments:  NSAIDs Associated symptoms: facial swelling   Associated symptoms: no congestion, no difficulty swallowing, no drooling, no facial pain, no fever, no gum swelling, no headaches, no neck pain, no neck swelling, no oral bleeding, no oral lesions and no trismus     Past Medical History  Diagnosis Date  . 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 Psoriasis     elbows, knees, left ankle  . Sensitive skin   . Teeth grinding   . TMJ locking     right jaw   Past Surgical History  Procedure Laterality Date  . Wisdom tooth extraction    . Cyst excision Right 11/19/2000    labium majus  . Wrist arthroscopy Right 06/23/2014    Procedure: ARTHROSCOPY WRIST AND ULNA SHORTENING OSTEOTOMY;  Surgeon: Jolyn Nap, MD;  Location: [facility];  Service: Orthopedics;  Laterality: Right;  . Ulna osteotomy Right 06/23/2014    Procedure: ULNAR SHORTENING;  Surgeon: Jolyn Nap, MD;  Location: [place];  Service: Orthopedics;  Laterality: Right;   Family History  Problem Relation Age of Onset  . Heart disease Mother     CABG   History    Substance Use Topics  . Smoking status: Former Smoker -- 0.33 packs/day for 18 years    Types: Cigarettes  . Smokeless tobacco: Current User     Comment: Vape  . Alcohol Use: No   OB History    Gravida Para Term Preterm AB TAB SAB Ectopic Multiple Living   2 1 1  1   1  1      Review of Systems  Constitutional: Negative for fever.  HENT: Positive for facial swelling. Negative for congestion, drooling and mouth sores.   Musculoskeletal: Negative for neck pain.  Neurological: Negative for headaches.  All other systems reviewed and are negative.   Allergies  Zithromax  Home Medications   Prior to Admission medications   Medication Sig Start Date End Date Taking? Authorizing Provider  amoxicillin (AMOXIL) 875 MG tablet Take 1 tablet (875 mg total) by mouth 2 (two) times daily. 11/01/14   Kandra Nicolas, MD  clobetasol ointment (TEMOVATE) 2.83 % Apply 1 application topically 3 (three) times daily.    Historical Provider, MD  fluocinonide (LIDEX) 0.05 % external solution Apply 1 application topically 2 (two) times daily.    Historical Provider, MD  HYDROcodone-acetaminophen (NORCO/VICODIN) 5-325 MG per tablet Take 1 tablet by mouth every 4 (four) hours as needed for severe pain. 11/01/14   Kandra Nicolas, MD  BP 111/75 mmHg  Pulse 77  Temp(Src) 98.4 F (36.9 C) (Oral)  Resp 16  Ht 5' 5.5" (1.664 m)  Wt 135 lb (61.236 kg)  BMI 22.12 kg/m2  SpO2 100% Physical Exam  Constitutional: She appears well-developed and well-nourished. No distress.  HENT:  Head: Normocephalic.  Nose: Nose normal.  Mouth/Throat: No oral lesions. No dental caries.    There is tenderness to tap over right mandibular tooth and gingiva as noted on diagram.  However, no fluctuance or swelling of gingiva.    Eyes: Conjunctivae are normal. Pupils are equal, round, and reactive to light.  Neck: Neck supple.  Cardiovascular: Normal heart sounds.   Pulmonary/Chest: Breath sounds normal.  Lymphadenopathy:     She has no cervical adenopathy.  Nursing note and vitals reviewed.   ED Course  Procedures  none   MDM   1. Pain, dental; ?early periapical abscess    Begin amoxicillin 875mg  BID.  Lortab for pain. May take Ibuprofen 200mg , 4 tabs every 8 hours with food as needed. If symptoms become significantly worse during the night or over the weekend, proceed to the local emergency room.  Followup with dentist as soon as possible.     Kandra Nicolas, MD 11/04/14 1325

## 2014-11-03 ENCOUNTER — Telehealth: Payer: Self-pay | Admitting: Emergency Medicine

## 2014-12-04 ENCOUNTER — Emergency Department (HOSPITAL_BASED_OUTPATIENT_CLINIC_OR_DEPARTMENT_OTHER): Payer: Self-pay

## 2014-12-04 ENCOUNTER — Emergency Department (HOSPITAL_BASED_OUTPATIENT_CLINIC_OR_DEPARTMENT_OTHER)
Admission: EM | Admit: 2014-12-04 | Discharge: 2014-12-04 | Disposition: A | Discharge status: Home / Self Care | Payer: Self-pay | Attending: Emergency Medicine | Admitting: Emergency Medicine

## 2014-12-04 ENCOUNTER — Encounter (HOSPITAL_BASED_OUTPATIENT_CLINIC_OR_DEPARTMENT_OTHER): Payer: Self-pay

## 2014-12-04 DIAGNOSIS — Z872 Personal history of diseases of the skin and subcutaneous tissue: Secondary | ICD-10-CM | POA: Insufficient documentation

## 2014-12-04 DIAGNOSIS — R000 Tachycardia, unspecified: Secondary | ICD-10-CM | POA: Insufficient documentation

## 2014-12-04 DIAGNOSIS — W010XXD Fall on same level from slipping, tripping and stumbling without subsequent striking against object, subsequent encounter: Secondary | ICD-10-CM | POA: Insufficient documentation

## 2014-12-04 DIAGNOSIS — Z7952 Long term (current) use of systemic steroids: Secondary | ICD-10-CM | POA: Insufficient documentation

## 2014-12-04 DIAGNOSIS — Z8739 Personal history of other diseases of the musculoskeletal system and connective tissue: Secondary | ICD-10-CM | POA: Insufficient documentation

## 2014-12-04 DIAGNOSIS — W19XXXA Unspecified fall, initial encounter: Secondary | ICD-10-CM

## 2014-12-04 DIAGNOSIS — S52601K Unspecified fracture of lower end of right ulna, subsequent encounter for closed fracture with nonunion: Secondary | ICD-10-CM | POA: Insufficient documentation

## 2014-12-04 DIAGNOSIS — Z87891 Personal history of nicotine dependence: Secondary | ICD-10-CM | POA: Insufficient documentation

## 2014-12-04 DIAGNOSIS — S52201K Unspecified fracture of shaft of right ulna, subsequent encounter for closed fracture with nonunion: Secondary | ICD-10-CM

## 2014-12-04 DIAGNOSIS — Z8659 Personal history of other mental and behavioral disorders: Secondary | ICD-10-CM | POA: Insufficient documentation

## 2014-12-04 MED ORDER — HYDROCODONE-ACETAMINOPHEN 5-325 MG PO TABS: 1.0000 | ORAL_TABLET | ORAL | Status: DC | PRN

## 2014-12-04 MED ORDER — HYDROCODONE-ACETAMINOPHEN 5-325 MG PO TABS
2.0000 | ORAL_TABLET | Freq: Once | ORAL | Status: AC
  Administered 2014-12-04: 2 via ORAL
  Filled 2014-12-04: qty 2

## 2014-12-04 NOTE — ED Notes (Signed)
Ice pack given

## 2014-12-04 NOTE — Discharge Instructions (Signed)
If you were given medicines take as directed.  If you are on coumadin or contraceptives realize their levels and effectiveness is altered by many different medicines.  If you have any reaction (rash, tongues swelling, other) to the medicines stop taking and see a physician.   For severe pain take norco or vicodin however realize they have the potential for addiction and it can make you sleepy and has tylenol in it.  No operating machinery while taking.  Please follow up as directed and return to the ER or see a physician for new or worsening symptoms.  Thank you. Filed Vitals:   12/04/14 1157  BP: 138/79  Pulse: 118  Temp: 99.5 F (37.5 C)  TempSrc: Oral  Resp: 16  Height: 5\' 6"  (1.676 m)  Weight: 123 lb (55.792 kg)  SpO2: 100%

## 2014-12-04 NOTE — ED Provider Notes (Signed)
CSN: [id number]     Arrival date & time 12/04/14  1148 History   First MD Initiated Contact with Patient 12/04/14 1209     Chief Complaint  Patient presents with  . Wrist Injury     (Consider location/radiation/quality/duration/timing/severity/associated sxs/prior Treatment) HPI Comments: 34 year old female with history of surgery for forearm fracture by Dr. Grandville Silos November 2015 presents with worsening pain and numbness on the all aspect of right arm since falling one are prior to arrival. Patient had been doing well at checkup in February per her report. Pain with palpation range of motion. Numbness sensation in ulnar distribution. No other injuries. 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  Patient is a 34 y.o. female presenting with wrist injury. The history is provided by the patient.  Wrist Injury Associated symptoms: no back pain, no fever and no neck pain     Past Medical History  Diagnosis Date  . 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 Psoriasis     elbows, knees, left ankle  . Sensitive skin   . Teeth grinding   . TMJ locking     right jaw   Past Surgical History  Procedure Laterality Date  . Wisdom tooth extraction    . Cyst excision Right 11/19/2000    labium majus  . Wrist arthroscopy Right 06/23/2014    Procedure: ARTHROSCOPY WRIST AND ULNA SHORTENING OSTEOTOMY;  Surgeon: Jolyn Nap, MD;  Location: [place];  Service: Orthopedics;  Laterality: Right;  . Ulna osteotomy Right 06/23/2014    Procedure: ULNAR SHORTENING;  Surgeon: Jolyn Nap, MD;  Location: [place];  Service: Orthopedics;  Laterality: Right;  . Forearm fracture surgery     Family History  Problem Relation Age of Onset  . Heart disease Mother     CABG   History  Substance Use Topics  . Smoking status: Former Smoker -- 0.33 packs/day for 18 years    Types: Cigarettes  . Smokeless tobacco: Current  User     Comment: Vape  . Alcohol Use: No   OB History    Gravida Para Term Preterm AB TAB SAB Ectopic Multiple Living   2 1 1  1   1  1      Review of Systems  Constitutional: Negative for fever and chills.  HENT: Negative for congestion.   Respiratory: Negative for shortness of breath.   Cardiovascular: Negative for chest pain.  Gastrointestinal: Negative for vomiting and abdominal pain.  Genitourinary: Negative for dysuria and flank pain.  Musculoskeletal: Negative for back pain, joint swelling, neck pain and neck stiffness.  Skin: Negative for rash.  Neurological: Positive for numbness. Negative for weakness, light-headedness and headaches.      Allergies  Zithromax  Home Medications   Prior to Admission medications   Medication Sig Start Date End Date Taking? Authorizing Provider  MELOXICAM PO Take by mouth.   Yes Historical Provider, MD  clobetasol ointment (TEMOVATE) 7.74 % Apply 1 application topically 3 (three) times daily.    Historical Provider, MD  fluocinonide (LIDEX) 0.05 % external solution Apply 1 application topically 2 (two) times daily.    Historical Provider, MD  HYDROcodone-acetaminophen (NORCO) 5-325 MG per tablet Take 1-2 tablets by mouth every 4 (four) hours as needed. 12/04/14   Elnora Morrison, MD   BP 138/79 mmHg  Pulse 118  Temp(Src) 99.5 F (37.5 C) (Oral)  Resp 16  Ht 5\' 6"  (1.676  m)  Wt 123 lb (55.792 kg)  BMI 19.86 kg/m2  SpO2 100%  LMP 11/10/2014 Physical Exam  Constitutional: She appears well-developed and well-nourished.  HENT:  Head: Normocephalic.  Cardiovascular: Tachycardia present.   Musculoskeletal: Normal range of motion. She exhibits tenderness.  Patient has tenderness to distal ulna on the right, no open wounds. No significant swelling. Patient has strong 5+ grip strength. Patient has 5+ strength with flexion extension of the fingers in that hand. No wrist effusion. Patient has sensation in ulnar distribution however feels less  than normal. 2+ pulses distally.  Neurological: She is alert.  Skin: Skin is warm.  Psychiatric: She has a normal mood and affect.  Nursing note and vitals reviewed.   ED Course  Procedures (including critical care time) Labs Review Labs Reviewed - No data to display  Imaging Review Dg Wrist Complete Right  12/04/2014   CLINICAL DATA:  Medial wrist pain and numbness post fall this morning  EXAM: RIGHT WRIST - COMPLETE 3+ VIEW  COMPARISON:  06/23/2014  FINDINGS: Four views of the right wrist submitted. Again noted a metallic fixation plate and screws in distal shaft of right ulna. There is lucency around distal aspect of the plate and distal screws highly suspicious for loosening of the plate. There is a fracture line in distal shaft of ulna at the level of oblique screw. This is consistent with nonunion of previous fracture or interval stress fracture of indeterminate age. No definite evidence of acute fracture. Clinical correlation is necessary. Stable old fracture of the ulnar styloid.  IMPRESSION: Again noted a metallic fixation plate and screws in distal shaft of right ulna. There is lucency around distal aspect of the plate and distal screws highly suspicious for loosening of the plate. There is a fracture line in distal shaft of ulna at the level of oblique screw. This is consistent with nonunion of previous fracture or interval stress fracture of indeterminate age. No definite evidence of acute fracture. Clinical correlation is necessary.   Electronically Signed   By: Lahoma Crocker M.D.   On: 12/04/2014 12:21     EKG Interpretation None      MDM   Final diagnoses:  Nonunion fracture of right ulna  Fall, initial encounter   Patient with known previous fracture and fall affecting the same arm. X-ray reviewed concern for loosening plate and nonunion. Splint and pain meds ordered in ER. Follow-up with her hand surgeon discussed, patient called in the ER and has an appointment this  afternoon.  Results and differential diagnosis were discussed with the patient/parent/guardian. Close follow up outpatient was discussed, comfortable with the plan.   Medications  HYDROcodone-acetaminophen (NORCO/VICODIN) 5-325 MG per tablet 2 tablet (not administered)    Filed Vitals:   12/04/14 1157  BP: 138/79  Pulse: 118  Temp: 99.5 F (37.5 C)  TempSrc: Oral  Resp: 16  Height: 5\' 6"  (1.676 m)  Weight: 123 lb (55.792 kg)  SpO2: 100%    Final diagnoses:  Nonunion fracture of right ulna  Fall, initial encounter        Elnora Morrison, MD 12/04/14 1309

## 2014-12-04 NOTE — ED Notes (Addendum)
Slipped/fell approx 1 hour PTA-pain, numbness/tingling to right wrist-hx of fx with surgery Nov 2015

## 2015-03-05 ENCOUNTER — Encounter (HOSPITAL_COMMUNITY): Payer: Self-pay | Admitting: Emergency Medicine

## 2015-03-05 ENCOUNTER — Emergency Department (HOSPITAL_COMMUNITY): Payer: Self-pay

## 2015-03-05 ENCOUNTER — Emergency Department (HOSPITAL_COMMUNITY)
Admission: EM | Admit: 2015-03-05 | Discharge: 2015-03-05 | Disposition: A | Discharge status: Home / Self Care | Payer: Self-pay | Attending: Emergency Medicine | Admitting: Emergency Medicine

## 2015-03-05 DIAGNOSIS — S6991XA Unspecified injury of right wrist, hand and finger(s), initial encounter: Secondary | ICD-10-CM | POA: Insufficient documentation

## 2015-03-05 DIAGNOSIS — Y9389 Activity, other specified: Secondary | ICD-10-CM | POA: Insufficient documentation

## 2015-03-05 DIAGNOSIS — Z87891 Personal history of nicotine dependence: Secondary | ICD-10-CM | POA: Insufficient documentation

## 2015-03-05 DIAGNOSIS — W108XXA Fall (on) (from) other stairs and steps, initial encounter: Secondary | ICD-10-CM | POA: Insufficient documentation

## 2015-03-05 DIAGNOSIS — S8011XA Contusion of right lower leg, initial encounter: Secondary | ICD-10-CM | POA: Insufficient documentation

## 2015-03-05 DIAGNOSIS — M25531 Pain in right wrist: Secondary | ICD-10-CM

## 2015-03-05 DIAGNOSIS — Y998 Other external cause status: Secondary | ICD-10-CM | POA: Insufficient documentation

## 2015-03-05 DIAGNOSIS — S8012XA Contusion of left lower leg, initial encounter: Secondary | ICD-10-CM | POA: Insufficient documentation

## 2015-03-05 DIAGNOSIS — Z79899 Other long term (current) drug therapy: Secondary | ICD-10-CM | POA: Insufficient documentation

## 2015-03-05 DIAGNOSIS — Z872 Personal history of diseases of the skin and subcutaneous tissue: Secondary | ICD-10-CM | POA: Insufficient documentation

## 2015-03-05 DIAGNOSIS — Y9289 Other specified places as the place of occurrence of the external cause: Secondary | ICD-10-CM | POA: Insufficient documentation

## 2015-03-05 DIAGNOSIS — Z8659 Personal history of other mental and behavioral disorders: Secondary | ICD-10-CM | POA: Insufficient documentation

## 2015-03-05 MED ORDER — IBUPROFEN 600 MG PO TABS: 600.0000 mg | ORAL_TABLET | Freq: Three times a day (TID) | ORAL | Status: DC | PRN

## 2015-03-05 MED ORDER — HYDROCODONE-ACETAMINOPHEN 5-325 MG PO TABS
1.0000 | ORAL_TABLET | Freq: Once | ORAL | Status: AC
  Administered 2015-03-05: 1 via ORAL
  Filled 2015-03-05: qty 1

## 2015-03-05 NOTE — ED Notes (Addendum)
Pt fell down approx 8 steps while going down the stairs in her basement.  Denies LOC.  Denies neck and back pain.  C/o pain to R wrist and bruises to bilateral legs.  States she is mostly concerned about R wrist due to hardware from previous surgery.  Ambulatory to triage without difficulty.

## 2015-03-05 NOTE — ED Provider Notes (Signed)
CSN: [id number]     Arrival date & time 03/05/15  0329 History   First MD Initiated Contact with Patient 03/05/15 0410     Chief Complaint  Patient presents with  . Wrist Pain  . Fall      HPI Patient underwent right ulnar surgery November 2015.  She states that this evening she fell and injured her right wrist again.  She complains of pain to the right wrist and bruising to her bilateral legs.  She denies difficulty walking.  Pain is mild to moderate in severity.  Her pain in her right wrist is worse with palpation and movement of her right wrist.  No other complaints at this time.  No chest pain shortness of breath.  No abdominal pain.  She denies head injury.   Past Medical History  Diagnosis Date  . 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 Psoriasis     elbows, knees, left ankle  . Sensitive skin   . Teeth grinding   . TMJ locking     right jaw   Past Surgical History  Procedure Laterality Date  . Wisdom tooth extraction    . Cyst excision Right 11/19/2000    labium majus  . Wrist arthroscopy Right 06/23/2014    Procedure: ARTHROSCOPY WRIST AND ULNA SHORTENING OSTEOTOMY;  Surgeon: Jolyn Nap, MD;  Location: [place];  Service: Orthopedics;  Laterality: Right;  . Ulna osteotomy Right 06/23/2014    Procedure: ULNAR SHORTENING;  Surgeon: Jolyn Nap, MD;  Location: [place];  Service: Orthopedics;  Laterality: Right;  . Forearm fracture surgery     Family History  Problem Relation Age of Onset  . Heart disease Mother     CABG   History  Substance Use Topics  . Smoking status: Former Smoker -- 0.33 packs/day for 18 years    Types: Cigarettes  . Smokeless tobacco: Current User     Comment: Vape  . Alcohol Use: Yes   OB History    Gravida Para Term Preterm AB TAB SAB Ectopic Multiple Living   2 1 1  1   1  1      Review of Systems  All other systems reviewed and are negative.     Allergies   Zithromax  Home Medications   Prior to Admission medications   Medication Sig Start Date End Date Taking? Authorizing Provider  clobetasol ointment (TEMOVATE) 8.34 % Apply 1 application topically 3 (three) times daily.    Historical Provider, MD  fluocinonide (LIDEX) 0.05 % external solution Apply 1 application topically 2 (two) times daily.    Historical Provider, MD  HYDROcodone-acetaminophen (NORCO) 5-325 MG per tablet Take 1-2 tablets by mouth every 4 (four) hours as needed. 12/04/14   Elnora Morrison, MD  MELOXICAM PO Take by mouth.    Historical Provider, MD   BP 125/83 mmHg  Pulse 107  Temp(Src) 99 F (37.2 C) (Oral)  Resp 22  Ht 5\' 6"  (1.676 m)  Wt 120 lb (54.432 kg)  BMI 19.38 kg/m2  SpO2 96%  LMP 03/05/2015 Physical Exam  Constitutional: She is oriented to person, place, and time. She appears well-developed and well-nourished. No distress.  HENT:  Head: Normocephalic and atraumatic.  Eyes: EOM are normal.  Neck: Normal range of motion.  Cardiovascular: Normal rate, regular rhythm and normal heart sounds.   Pulmonary/Chest: Effort normal and breath sounds normal.  Abdominal: Soft. She exhibits no distension. There is  no tenderness.  Musculoskeletal: Normal range of motion.  Painful range of motion of right wrist without obvious deformity.  Normal right radial pulse.  Normal grip strength of right hand.  Bruising noted to the anterior tibial regions bilaterally.  No lacerations.  Full range of motion of bilateral ankles and bilateral knees.  Neurological: She is alert and oriented to person, place, and time.  Skin: Skin is warm and dry.  Psychiatric: She has a normal mood and affect. Judgment normal.  Nursing note and vitals reviewed.   ED Course  Procedures (including critical care time) Labs Review Labs Reviewed - No data to display  Imaging Review Dg Wrist Complete Right  03/05/2015   CLINICAL DATA:  [place] down the basement stairs tonight.  EXAM: RIGHT WRIST -  COMPLETE 3+ VIEW  COMPARISON:  None.  FINDINGS: Negative for fracture, dislocation or radiopaque foreign body. There is volar plate screw fixation of the distal fibular diaphysis. There are old ulnar styloid fragments.  IMPRESSION: Negative for acute fracture   Electronically Signed   By: Andreas Newport M.D.   On: 03/05/2015 04:28  I personally reviewed the imaging tests through PACS system I reviewed available ER/hospitalization records through the EMR    EKG Interpretation None      MDM   Final diagnoses:  Right wrist pain    X-ray without acute pathology.  Patient be referred back to her orthopedic hand surgeon.  Home with anti-inflammatories and ice.    Jola Schmidt, MD 03/05/15 [phone]

## 2015-03-05 NOTE — Discharge Instructions (Signed)

## 2015-04-06 ENCOUNTER — Encounter (HOSPITAL_COMMUNITY): Payer: Self-pay | Admitting: Family Medicine

## 2015-04-06 ENCOUNTER — Emergency Department (HOSPITAL_COMMUNITY): Payer: Self-pay

## 2015-04-06 ENCOUNTER — Emergency Department (HOSPITAL_COMMUNITY)
Admission: EM | Admit: 2015-04-06 | Discharge: 2015-04-06 | Disposition: A | Discharge status: Home / Self Care | Payer: Self-pay | Attending: Emergency Medicine | Admitting: Emergency Medicine

## 2015-04-06 DIAGNOSIS — Z7952 Long term (current) use of systemic steroids: Secondary | ICD-10-CM | POA: Insufficient documentation

## 2015-04-06 DIAGNOSIS — Z87891 Personal history of nicotine dependence: Secondary | ICD-10-CM | POA: Insufficient documentation

## 2015-04-06 DIAGNOSIS — Y999 Unspecified external cause status: Secondary | ICD-10-CM | POA: Insufficient documentation

## 2015-04-06 DIAGNOSIS — Z872 Personal history of diseases of the skin and subcutaneous tissue: Secondary | ICD-10-CM | POA: Insufficient documentation

## 2015-04-06 DIAGNOSIS — S52601A Unspecified fracture of lower end of right ulna, initial encounter for closed fracture: Secondary | ICD-10-CM | POA: Insufficient documentation

## 2015-04-06 DIAGNOSIS — Y939 Activity, unspecified: Secondary | ICD-10-CM | POA: Insufficient documentation

## 2015-04-06 DIAGNOSIS — X58XXXA Exposure to other specified factors, initial encounter: Secondary | ICD-10-CM | POA: Insufficient documentation

## 2015-04-06 DIAGNOSIS — Y929 Unspecified place or not applicable: Secondary | ICD-10-CM | POA: Insufficient documentation

## 2015-04-06 MED ORDER — HYDROCODONE-ACETAMINOPHEN 5-325 MG PO TABS: 2.0000 | ORAL_TABLET | ORAL | Status: DC | PRN

## 2015-04-06 NOTE — ED Provider Notes (Signed)
CSN: [id number]     Arrival date & time 04/06/15  1245 History  This chart was scribed for Alyse Low, [employer], working with Lajean Saver, MD by Steva Colder, ED Scribe. The patient was seen in room TR01C/TR01C at 1:50 PM.    Chief Complaint  Patient presents with  . Arm Pain     The history is provided by the patient. No language interpreter was used.    Lindsey Martin is a 34 y.o. female who presents to the Emergency Department complaining of right arm pain onset 2 weeks ago. Pt reports that she just had surgery to the right arm for a forearm fracture. Pt notes that her forearm fracture was due to her tripping going up stairs. Pt states that she took her cast off and she can now feel lumps on her arm and she thinks that the metal rod in her arm is making her sick. Pt notes that she broke her forearm again in March 2016 and it was a compound fracture. Pt thinks that since she re-broke the arm she has had pain to the area. Pt reports that she called her surgeon Dr. Grandville Silos who completed the surgery initially in November 2015. Dr. Grandville Silos informed her that he is on-call today and to come into the ED. She reports that she is having associated symptoms of mild right wrist swelling and right wrist pain. She denies color change, wound, rash, joint swelling, and any other symptoms.    Past Medical History  Diagnosis Date  . 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 Psoriasis     elbows, knees, left ankle  . Sensitive skin   . Teeth grinding   . TMJ locking     right jaw   Past Surgical History  Procedure Laterality Date  . Wisdom tooth extraction    . Cyst excision Right 11/19/2000    labium majus  . Wrist arthroscopy Right 06/23/2014    Procedure: ARTHROSCOPY WRIST AND ULNA SHORTENING OSTEOTOMY;  Surgeon: Jolyn Nap, MD;  Location: [facility];  Service: Orthopedics;  Laterality: Right;  . Ulna osteotomy Right 06/23/2014    Procedure: ULNAR  SHORTENING;  Surgeon: Jolyn Nap, MD;  Location: [facility];  Service: Orthopedics;  Laterality: Right;  . Forearm fracture surgery     Family History  Problem Relation Age of Onset  . Heart disease Mother     CABG   Social History  Substance Use Topics  . Smoking status: Former Smoker -- 0.33 packs/day for 18 years    Types: Cigarettes  . Smokeless tobacco: Current User     Comment: Vape  . Alcohol Use: Yes   OB History    Gravida Para Term Preterm AB TAB SAB Ectopic Multiple Living   2 1 1  1   1  1      Review of Systems  Musculoskeletal: Positive for joint swelling (right wrist) and arthralgias.  Skin: Negative for color change, rash and wound.      Allergies  Zithromax  Home Medications   Prior to Admission medications   Medication Sig Start Date End Date Taking? Authorizing Provider  clobetasol ointment (TEMOVATE) 5.46 % Apply 1 application topically 3 (three) times daily.    Historical Provider, MD  fluocinonide (LIDEX) 0.05 % external solution Apply 1 application topically 2 (two) times daily.    Historical Provider, MD  HYDROcodone-acetaminophen (NORCO) 5-325 MG per tablet Take 1-2 tablets by mouth  every 4 (four) hours as needed. 12/04/14   Elnora Morrison, MD  MELOXICAM PO Take by mouth.    Historical Provider, MD   BP 122/71 mmHg  Pulse 80  Temp(Src) 98.8 F (37.1 C) (Oral)  Resp 18  Ht 5\' 5"  (1.651 m)  Wt 110 lb (49.896 kg)  BMI 18.31 kg/m2  SpO2 98%  LMP 04/03/2015 Physical Exam  Constitutional: She is oriented to person, place, and time. She appears well-developed and well-nourished. No distress.  HENT:  Head: Normocephalic and atraumatic.  Eyes: EOM are normal.  Neck: Neck supple. No tracheal deviation present.  Cardiovascular: Normal rate.   Pulmonary/Chest: Effort normal. No respiratory distress.  Musculoskeletal: Normal range of motion.  Healed incision right forearm. Slight swelling at right wrist. Pain with ROM. NVI and  neurosensory intact.  Neurological: She is alert and oriented to person, place, and time.  Skin: Skin is warm and dry.  Psychiatric: She has a normal mood and affect. Her behavior is normal.  Nursing note and vitals reviewed.   ED Course  Procedures (including critical care time) DIAGNOSTIC STUDIES: Oxygen Saturation is 98% on RA, nl by my interpretation.    COORDINATION OF CARE: 1:58 PM Discussed treatment plan with pt at bedside and pt agreed to plan.    Labs Review Labs Reviewed - No data to display  Imaging Review Dg Forearm Right  04/06/2015   CLINICAL DATA:  Increasing right forearm pain for 2 weeks. Status post right lung surgery in November 2015.  EXAM: RIGHT FOREARM - 2 VIEW  COMPARISON:  03/05/2015  FINDINGS: There has been prior plate and screw fixation of midshaft ulnar fracture. There is a nondisplaced acute fracture of the distal ulnar shaft at the level of the distal end of the fixation plate. The old more proximal fracture is healed. There is soft tissue swelling.  IMPRESSION: Acute nondisplaced transverse fracture of the distal ulnar shaft, at the level of the distal end of the fixation plate.   Electronically Signed   By: Fidela Salisbury M.D.   On: 04/06/2015 15:00   Alyse Low, PA-C, have personally reviewed and evaluated these images and lab results as part of my medical decision-making.    EKG Interpretation None      MDM   Final diagnoses:  Ulna distal fracture, right, closed, initial encounter   Patient has a new fracture at the distal end of hardware near the distal ulna.   See Dr. Grandville Silos for evaluation  Hydrocodone Cast   I personally performed the services in this documentation, which was scribed in my presence.  The recorded information has been reviewed and considered.   Ronnald Collum.   Hollace Kinnier [place], PA-C 04/06/15 [place], MD 04/06/15 [phone]

## 2015-04-06 NOTE — ED Notes (Signed)
Ortho tech paged  

## 2015-04-06 NOTE — Progress Notes (Signed)
Orthopedic Tech Progress Note Patient Details:  EVELENA MASCI Oct 22, 1980 [id number]  Ortho Devices Ortho Device/Splint Interventions: Application   Cammer, Theodoro Parma 04/06/2015, 3:53 PM

## 2015-04-06 NOTE — ED Notes (Signed)
Pt stable, ambulatory, states understanding of discharge instructions 

## 2015-04-06 NOTE — Discharge Instructions (Signed)
Forearm Fracture °Your caregiver has diagnosed you as having a broken bone (fracture) of the forearm. This is the part of your arm between the elbow and your wrist. Your forearm is made up of two bones. These are the radius and ulna. A fracture is a break in one or both bones. A cast or splint is used to protect and keep your injured bone from moving. The cast or splint will be on generally for about 5 to 6 weeks, with individual variations. °HOME CARE INSTRUCTIONS  °· Keep the injured part elevated while sitting or lying down. Keeping the injury above the level of your heart (the center of the chest). This will decrease swelling and pain. °· Apply ice to the injury for 15-20 minutes, 03-04 times per day while awake, for 2 days. Put the ice in a plastic bag and place a thin towel between the bag of ice and your cast or splint. °· If you have a plaster or fiberglass cast: °¨ Do not try to scratch the skin under the cast using sharp or pointed objects. °¨ Check the skin around the cast every day. You may put lotion on any red or sore areas. °¨ Keep your cast dry and clean. °· If you have a plaster splint: °¨ Wear the splint as directed. °¨ You may loosen the elastic around the splint if your fingers become numb, tingle, or turn cold or blue. °· Do not put pressure on any part of your cast or splint. It may break. Rest your cast only on a pillow the first 24 hours until it is fully hardened. °· Your cast or splint can be protected during bathing with a plastic bag. Do not lower the cast or splint into water. °· Only take over-the-counter or prescription medicines for pain, discomfort, or fever as directed by your caregiver. °SEEK IMMEDIATE MEDICAL CARE IF:  °· Your cast gets damaged or breaks. °· You have more severe pain or swelling than you did before the cast. °· Your skin or nails below the injury turn blue or gray, or feel cold or numb. °· There is a bad smell or new stains and/or pus like (purulent) drainage  coming from under the cast. °MAKE SURE YOU:  °· Understand these instructions. °· Will watch your condition. °· Will get help right away if you are not doing well or get worse. °Document Released: 07/18/2000 Document Revised: 10/13/2011 Document Reviewed: 03/09/2008 °ExitCare® Patient Information ©2015 [employer]. This information is not intended to replace advice given to you by your health care provider. Make sure you discuss any questions you have with your health care provider. ° °

## 2015-04-06 NOTE — ED Notes (Signed)
Pt here for right arm pain, nausea, weakness.  sts from arm surgery. she took her cast off and she can feel lumps on her arm. sts she thinks the metal in her arm is making her sick.

## 2015-04-11 ENCOUNTER — Encounter (HOSPITAL_BASED_OUTPATIENT_CLINIC_OR_DEPARTMENT_OTHER): Payer: Self-pay | Admitting: *Deleted

## 2015-04-11 ENCOUNTER — Other Ambulatory Visit: Payer: Self-pay | Admitting: Orthopedic Surgery

## 2015-04-12 NOTE — H&P (Signed)
Lindsey Martin is an 34 y.o. female.   CC / Reason for Visit: f/u right arm HPI: This patient returns for reevaluation, earlier than her previously scheduled followup.  She recently again went to urgent care, where x-rays were obtained that now reveal evidence for healing of a nondisplaced fracture at the distal edge of the ulnar plate.  In addition however, they reveal that the osteotomy appears to have healed.  She continues to have pain that is sharper at some localized areas, but otherwise is more diffuse.  She thinks that she is bothered by the presence of the metal.  HPI 03-21-15:  This patient returns for previously unscheduled followup, somewhat early, in her short arm cast.  She complains of pain inside the cast on the radial side, as well as some ulnar pain that seems to be more proximal to the previous site of nonunion of the osteotomy.  She has pain with attempted forearm rotation in the cast.  She reports that "something is not right in there".  HPI 03-05-15: This patient returns to clinic indicating that she fell down a flight of stairs yesterday and hit her midforearm on the banister on the way down.  She reports that she has intense pain over the region of her nonunion as well as numbness or altered sensibility on the dorsum of her hand.  She attempted to self treat with ice and ibuprofen, but ultimately she woke up in the middle of the night and had to go to the emergency department.  She reports that that they took new x-rays of both her wrist and her forearm and did not prescribe her any medications secondary to her refusal and that she would get medications here.    Past Medical History  Diagnosis Date  . 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 Psoriasis     elbows, knees, left ankle  . Sensitive skin   . Teeth grinding   . TMJ locking     right jaw  . Psoriasis   . Anxiety     Past Surgical History  Procedure Laterality Date  . Wisdom tooth  extraction    . Cyst excision Right 11/19/2000    labium majus  . Wrist arthroscopy Right 06/23/2014    Procedure: ARTHROSCOPY WRIST AND ULNA SHORTENING OSTEOTOMY;  Surgeon: Jolyn Nap, MD;  Location: [facility];  Service: Orthopedics;  Laterality: Right;  . Ulna osteotomy Right 06/23/2014    Procedure: ULNAR SHORTENING;  Surgeon: Jolyn Nap, MD;  Location: [facility];  Service: Orthopedics;  Laterality: Right;  . Forearm fracture surgery      Family History  Problem Relation Age of Onset  . Heart disease Mother     CABG   Social History:  reports that she has quit smoking. Her smoking use included Cigarettes. She has a 5.94 pack-year smoking history. She uses smokeless tobacco. She reports that she drinks alcohol. She reports that she uses illicit drugs (Marijuana).  Allergies:  Allergies  Allergen Reactions  . Zithromax [Azithromycin] Hives    No prescriptions prior to admission    No results found for this or any previous visit (from the past 71 hour(s)). No results found.  Review of Systems  All other systems reviewed and are negative.   Height 5\' 6"  (1.676 m), weight 49.896 kg (110 lb), last menstrual period 03/30/2015. Physical Exam  Constitutional:  WD, WN, NAD HEENT:  NCAT, EOMI Neuro/Psych:  Alert &  oriented to person, place, and time; appropriate mood & affect Lymphatic: No generalized UE edema or lymphadenopathy Extremities / MSK:  Both UE are normal with respect to appearance, ranges of motion, joint stability, muscle strength/tone, sensation, & perfusion except as otherwise noted:  There is some soft tissue enlargement that may represent a underlying bony healing that is clinically located in the distal edge of the plate.  It is tender to palpation.  There is also tenderness more diffusely about the location of the plate.  Labs / X-rays:  2 views of the right forearm obtained within the last week are reviewed.  Halos  remain present and static around the distal locked screws in the plate.  There actually appears to be greater healing through the osteotomy site and some periosteal new bone extending proximally along the shaft of the ulna extending proximal to the proximal edge of the plate.  In addition, there is discernible callus about the visualized cortical fracture in the distal edge of the plate, possibly involving the distal screw hole.  Assessment:  Right ulnar osteotomy that appears to have healed, now with a more acute unrelated fracture new the distal edge of the plate that radiographically appears consistent with a 5-6 weeks of healing, as would be consistent with her clinical course having sustained a fall on 03-04-15.  Plan: The findings were discussed with the patient.  She was provided a prescription for Percocet, but we also discussed my need to begin to taper her supplying use of such medication.  We agreed to endeavor to remove the plate on Monday, and that she would begin to use the splint less frequently to see if some of her discomfort may be splint related and subsequently resolved with less splint use.    The details of the operative procedure were discussed with the patient.  Questions were invited and answered.  In addition to the goal of the procedure, the risks of the procedure to include but not limited to bleeding; infection; damage to the nerves or blood vessels that could result in bleeding, numbness, weakness, chronic pain, and the need for additional procedures; stiffness; the need for revision surgery; and anesthetic risks, were reviewed.  No specific outcome was guaranteed or implied.  Informed consent was obtained.  Catie Chiao A. 04/12/2015, 4:48 PM

## 2015-04-16 ENCOUNTER — Encounter (HOSPITAL_BASED_OUTPATIENT_CLINIC_OR_DEPARTMENT_OTHER): Payer: Self-pay

## 2015-04-16 ENCOUNTER — Encounter (HOSPITAL_BASED_OUTPATIENT_CLINIC_OR_DEPARTMENT_OTHER)
Admission: RE | Disposition: A | Discharge status: Home / Self Care | Payer: Self-pay | Source: Ambulatory Visit | Attending: Orthopedic Surgery

## 2015-04-16 ENCOUNTER — Ambulatory Visit (HOSPITAL_BASED_OUTPATIENT_CLINIC_OR_DEPARTMENT_OTHER): Payer: Self-pay | Admitting: Anesthesiology

## 2015-04-16 ENCOUNTER — Ambulatory Visit (HOSPITAL_BASED_OUTPATIENT_CLINIC_OR_DEPARTMENT_OTHER)
Admission: RE | Admit: 2015-04-16 | Discharge: 2015-04-16 | Disposition: A | Discharge status: Home / Self Care | Payer: Self-pay | Source: Ambulatory Visit | Attending: Orthopedic Surgery | Admitting: Orthopedic Surgery

## 2015-04-16 DIAGNOSIS — T8484XA Pain due to internal orthopedic prosthetic devices, implants and grafts, initial encounter: Secondary | ICD-10-CM | POA: Insufficient documentation

## 2015-04-16 DIAGNOSIS — Y838 Other surgical procedures as the cause of abnormal reaction of the patient, or of later complication, without mention of misadventure at the time of the procedure: Secondary | ICD-10-CM | POA: Insufficient documentation

## 2015-04-16 DIAGNOSIS — Z87891 Personal history of nicotine dependence: Secondary | ICD-10-CM | POA: Insufficient documentation

## 2015-04-16 HISTORY — PX: HARDWARE REMOVAL: SHX979

## 2015-04-16 HISTORY — DX: Anxiety disorder, unspecified: F41.9

## 2015-04-16 LAB — POCT HEMOGLOBIN-HEMACUE: Hemoglobin: 13.6 g/dL (ref 12.0–15.0)

## 2015-04-16 SURGERY — REMOVAL, HARDWARE
Anesthesia: General | Site: Arm Lower | Laterality: Right

## 2015-04-16 MED ORDER — GLYCOPYRROLATE 0.2 MG/ML IJ SOLN: 0.2000 mg | Freq: Once | INTRAMUSCULAR | Status: DC | PRN

## 2015-04-16 MED ORDER — PROPOFOL 10 MG/ML IV BOLUS
INTRAVENOUS | Status: DC | PRN
  Administered 2015-04-16: 200 mg via INTRAVENOUS

## 2015-04-16 MED ORDER — HYDROMORPHONE HCL 1 MG/ML IJ SOLN
INTRAMUSCULAR | Status: AC
  Filled 2015-04-16: qty 1

## 2015-04-16 MED ORDER — MIDAZOLAM HCL 2 MG/2ML IJ SOLN
INTRAMUSCULAR | Status: AC
  Filled 2015-04-16: qty 4

## 2015-04-16 MED ORDER — DEXAMETHASONE SODIUM PHOSPHATE 4 MG/ML IJ SOLN
INTRAMUSCULAR | Status: DC | PRN
  Administered 2015-04-16: 10 mg via INTRAVENOUS

## 2015-04-16 MED ORDER — OXYCODONE HCL 5 MG/5ML PO SOLN: 5.0000 mg | Freq: Once | ORAL | Status: DC | PRN

## 2015-04-16 MED ORDER — BUPIVACAINE-EPINEPHRINE 0.5% -1:200000 IJ SOLN
INTRAMUSCULAR | Status: DC | PRN
  Administered 2015-04-16: 10 mL

## 2015-04-16 MED ORDER — LACTATED RINGERS IV SOLN: INTRAVENOUS | Status: DC

## 2015-04-16 MED ORDER — FENTANYL CITRATE (PF) 100 MCG/2ML IJ SOLN
50.0000 ug | INTRAMUSCULAR | Status: AC | PRN
  Administered 2015-04-16 (×3): 25 ug via INTRAVENOUS
  Administered 2015-04-16: 100 ug via INTRAVENOUS
  Administered 2015-04-16: 25 ug via INTRAVENOUS

## 2015-04-16 MED ORDER — CEFAZOLIN SODIUM-DEXTROSE 2-3 GM-% IV SOLR
INTRAVENOUS | Status: AC
  Filled 2015-04-16: qty 50

## 2015-04-16 MED ORDER — LIDOCAINE HCL (CARDIAC) 20 MG/ML IV SOLN
INTRAVENOUS | Status: AC
Start: 2015-04-16 — End: 2015-04-16
  Filled 2015-04-16: qty 5

## 2015-04-16 MED ORDER — ONDANSETRON HCL 4 MG/2ML IJ SOLN
INTRAMUSCULAR | Status: DC | PRN
  Administered 2015-04-16: 4 mg via INTRAVENOUS

## 2015-04-16 MED ORDER — CEFAZOLIN SODIUM-DEXTROSE 2-3 GM-% IV SOLR
2.0000 g | INTRAVENOUS | Status: AC
  Administered 2015-04-16: 2 g via INTRAVENOUS

## 2015-04-16 MED ORDER — OXYCODONE HCL 5 MG PO TABS: 5.0000 mg | ORAL_TABLET | Freq: Once | ORAL | Status: DC | PRN

## 2015-04-16 MED ORDER — SCOPOLAMINE 1 MG/3DAYS TD PT72
1.0000 | MEDICATED_PATCH | Freq: Once | TRANSDERMAL | Status: DC | PRN
  Administered 2015-04-16: 1.5 mg via TRANSDERMAL

## 2015-04-16 MED ORDER — ONDANSETRON HCL 4 MG/2ML IJ SOLN
INTRAMUSCULAR | Status: AC
  Filled 2015-04-16: qty 2

## 2015-04-16 MED ORDER — FENTANYL CITRATE (PF) 100 MCG/2ML IJ SOLN
INTRAMUSCULAR | Status: AC
  Filled 2015-04-16: qty 4

## 2015-04-16 MED ORDER — BUPIVACAINE-EPINEPHRINE (PF) 0.5% -1:200000 IJ SOLN
INTRAMUSCULAR | Status: AC
  Filled 2015-04-16: qty 30

## 2015-04-16 MED ORDER — MIDAZOLAM HCL 2 MG/2ML IJ SOLN
1.0000 mg | INTRAMUSCULAR | Status: DC | PRN
  Administered 2015-04-16: 2 mg via INTRAVENOUS

## 2015-04-16 MED ORDER — LIDOCAINE HCL (CARDIAC) 20 MG/ML IV SOLN
INTRAVENOUS | Status: DC | PRN
  Administered 2015-04-16: 80 mg via INTRAVENOUS

## 2015-04-16 MED ORDER — LIDOCAINE HCL (PF) 1 % IJ SOLN
INTRAMUSCULAR | Status: AC
Start: 2015-04-16 — End: 2015-04-16
  Filled 2015-04-16: qty 30

## 2015-04-16 MED ORDER — LACTATED RINGERS IV SOLN
INTRAVENOUS | Status: DC
  Administered 2015-04-16: 09:00:00 via INTRAVENOUS

## 2015-04-16 MED ORDER — OXYCODONE-ACETAMINOPHEN 10-325 MG PO TABS: 1.0000 | ORAL_TABLET | Freq: Four times a day (QID) | ORAL | Status: DC | PRN

## 2015-04-16 MED ORDER — MEPERIDINE HCL 25 MG/ML IJ SOLN: 6.2500 mg | INTRAMUSCULAR | Status: DC | PRN

## 2015-04-16 MED ORDER — DEXAMETHASONE SODIUM PHOSPHATE 10 MG/ML IJ SOLN
INTRAMUSCULAR | Status: AC
  Filled 2015-04-16: qty 1

## 2015-04-16 MED ORDER — HYDROMORPHONE HCL 1 MG/ML IJ SOLN
0.2500 mg | INTRAMUSCULAR | Status: DC | PRN
  Administered 2015-04-16: 0.5 mg via INTRAVENOUS

## 2015-04-16 MED ORDER — SCOPOLAMINE 1 MG/3DAYS TD PT72
MEDICATED_PATCH | TRANSDERMAL | Status: AC
  Filled 2015-04-16: qty 1

## 2015-04-16 SURGICAL SUPPLY — 60 items
BANDAGE COBAN STERILE 2 (GAUZE/BANDAGES/DRESSINGS) IMPLANT
BANDAGE ESMARK 6X9 LF (GAUZE/BANDAGES/DRESSINGS) IMPLANT
BLADE MINI RND TIP GREEN BEAV (BLADE) IMPLANT
BLADE SURG 15 STRL LF DISP TIS (BLADE) ×2 IMPLANT
BLADE SURG 15 STRL SS (BLADE) ×2
BNDG CMPR 9X4 STRL LF SNTH (GAUZE/BANDAGES/DRESSINGS) ×1
BNDG CMPR 9X6 STRL LF SNTH (GAUZE/BANDAGES/DRESSINGS)
BNDG COHESIVE 4X5 TAN STRL (GAUZE/BANDAGES/DRESSINGS) ×2 IMPLANT
BNDG ESMARK 4X9 LF (GAUZE/BANDAGES/DRESSINGS) ×1 IMPLANT
BNDG ESMARK 6X9 LF (GAUZE/BANDAGES/DRESSINGS)
BNDG GAUZE 1X2.1 STRL (MISCELLANEOUS) IMPLANT
BNDG GAUZE ELAST 4 BULKY (GAUZE/BANDAGES/DRESSINGS) ×4 IMPLANT
CHLORAPREP W/TINT 26ML (MISCELLANEOUS) ×2 IMPLANT
CORDS BIPOLAR (ELECTRODE) ×1 IMPLANT
COVER BACK TABLE 60X90IN (DRAPES) ×2 IMPLANT
COVER MAYO STAND STRL (DRAPES) ×2 IMPLANT
CUFF TOURNIQUET SINGLE 18IN (TOURNIQUET CUFF) ×1 IMPLANT
CUFF TOURNIQUET SINGLE 34IN LL (TOURNIQUET CUFF) IMPLANT
DRAIN PENROSE 1/2X12 LTX STRL (WOUND CARE) IMPLANT
DRAPE C-ARM 42X72 X-RAY (DRAPES) IMPLANT
DRAPE EXTREMITY T 121X128X90 (DRAPE) ×2 IMPLANT
DRAPE OEC MINIVIEW 54X84 (DRAPES) IMPLANT
DRAPE SURG 17X23 STRL (DRAPES) ×2 IMPLANT
DRSG EMULSION OIL 3X3 NADH (GAUZE/BANDAGES/DRESSINGS) ×2 IMPLANT
DRSG PAD ABDOMINAL 8X10 ST (GAUZE/BANDAGES/DRESSINGS) IMPLANT
ELECT REM PT RETURN 9FT ADLT (ELECTROSURGICAL)
ELECTRODE REM PT RTRN 9FT ADLT (ELECTROSURGICAL) IMPLANT
GAUZE SPONGE 4X4 12PLY STRL (GAUZE/BANDAGES/DRESSINGS) ×2 IMPLANT
GLOVE BIO SURGEON STRL SZ7.5 (GLOVE) ×2 IMPLANT
GLOVE BIOGEL PI IND STRL 7.0 (GLOVE) ×1 IMPLANT
GLOVE BIOGEL PI IND STRL 8 (GLOVE) ×1 IMPLANT
GLOVE BIOGEL PI INDICATOR 7.0 (GLOVE) ×2
GLOVE BIOGEL PI INDICATOR 8 (GLOVE) ×1
GLOVE ECLIPSE 6.5 STRL STRAW (GLOVE) ×2 IMPLANT
GOWN STRL REUS W/ TWL LRG LVL3 (GOWN DISPOSABLE) ×2 IMPLANT
GOWN STRL REUS W/TWL LRG LVL3 (GOWN DISPOSABLE) ×4
GOWN STRL REUS W/TWL XL LVL3 (GOWN DISPOSABLE) ×2 IMPLANT
NDL HYPO 25X1 1.5 SAFETY (NEEDLE) IMPLANT
NEEDLE HYPO 25X1 1.5 SAFETY (NEEDLE) ×2 IMPLANT
NS IRRIG 1000ML POUR BTL (IV SOLUTION) ×2 IMPLANT
PACK BASIN DAY SURGERY FS (CUSTOM PROCEDURE TRAY) ×2 IMPLANT
PADDING CAST ABS 4INX4YD NS (CAST SUPPLIES) ×1
PADDING CAST ABS COTTON 4X4 ST (CAST SUPPLIES) IMPLANT
PENCIL BUTTON HOLSTER BLD 10FT (ELECTRODE) IMPLANT
RUBBERBAND STERILE (MISCELLANEOUS) IMPLANT
STOCKINETTE 6  STRL (DRAPES) ×1
STOCKINETTE 6 STRL (DRAPES) ×1 IMPLANT
SUCTION FRAZIER TIP 10 FR DISP (SUCTIONS) IMPLANT
SUT ETHILON 3 0 PS 1 (SUTURE) IMPLANT
SUT VIC AB 2-0 PS2 27 (SUTURE) IMPLANT
SUT VICRYL RAPIDE 4-0 (SUTURE) IMPLANT
SUT VICRYL RAPIDE 4/0 PS 2 (SUTURE) ×1 IMPLANT
SWAB COLLECTION DEVICE MRSA (MISCELLANEOUS) IMPLANT
SYR BULB 3OZ (MISCELLANEOUS) ×2 IMPLANT
SYRINGE 10CC LL (SYRINGE) ×1 IMPLANT
TOWEL OR 17X24 6PK STRL BLUE (TOWEL DISPOSABLE) ×2 IMPLANT
TOWEL OR NON WOVEN STRL DISP B (DISPOSABLE) ×2 IMPLANT
TUBE ANAEROBIC SPECIMEN COL (MISCELLANEOUS) IMPLANT
TUBE CONNECTING 20X1/4 (TUBING) IMPLANT
UNDERPAD 30X30 (UNDERPADS AND DIAPERS) ×2 IMPLANT

## 2015-04-16 NOTE — Anesthesia Procedure Notes (Signed)
Procedure Name: LMA Insertion Date/Time: 04/16/2015 10:18 AM Performed by: Lyndee Leo Pre-anesthesia Checklist: Patient identified, Emergency Drugs available, Suction available and Patient being monitored Patient Re-evaluated:Patient Re-evaluated prior to inductionOxygen Delivery Method: Circle System Utilized Preoxygenation: Pre-oxygenation with 100% oxygen Intubation Type: IV induction Ventilation: Mask ventilation without difficulty LMA: LMA inserted LMA Size: 4.0 Number of attempts: 1 Airway Equipment and Method: Bite block Placement Confirmation: positive ETCO2 Tube secured with: Tape Dental Injury: Teeth and Oropharynx as per pre-operative assessment

## 2015-04-16 NOTE — Anesthesia Postprocedure Evaluation (Signed)
  Anesthesia Post-op Note  Patient: Lindsey Martin  Procedure(s) Performed: Procedure(s): REMOVAL OF RIGHT ULNA PLATE (Right)  Patient Location: PACU  Anesthesia Type: General   Level of Consciousness: awake, alert  and oriented  Airway and Oxygen Therapy: Patient Spontanous Breathing  Post-op Pain: mild  Post-op Assessment: Post-op Vital signs reviewed  Post-op Vital Signs: Reviewed  Last Vitals:  Filed Vitals:   04/16/15 1155  BP:   Pulse: 80  Temp:   Resp: 20    Complications: No apparent anesthesia complications

## 2015-04-16 NOTE — Discharge Instructions (Signed)
Discharge Instructions   You have a dressing with a plaster splint incorporated in it. Move your fingers as much as possible, making a full fist and fully opening the fist. Elevate your hand to reduce pain & swelling of the digits.  Ice over the operative site may be helpful to reduce pain & swelling.  DO NOT USE HEAT. Pain medicine has been prescribed for you.  Use your medicine as needed over the first 48 hours, and then you can begin to taper your use.  You may use Tylenol in place of your prescribed pain medication, but not IN ADDITION to it. Leave the dressing in place until you return to our office.  You may shower, but keep the bandage clean & dry.  You may drive a car when you are off of prescription pain medications and can safely control your vehicle with both hands. Call and make an appointment in 10-15 days after surgery date for follow up.   Please call [phone] during normal business hours or [phone] after hours for any problems. Including the following:  - excessive redness of the incisions - drainage for more than 4 days - fever of more than 101.5 F  *Please note that pain medications will not be refilled after hours or on weekends.   Post Anesthesia Home Care Instructions  Activity: Get plenty of rest for the remainder of the day. A responsible adult should stay with you for 24 hours following the procedure.  For the next 24 hours, DO NOT: -Drive a car -Paediatric nurse -Drink alcoholic beverages -Take any medication unless instructed by your physician -Make any legal decisions or sign important papers.  Meals: Start with liquid foods such as gelatin or soup. Progress to regular foods as tolerated. Avoid greasy, spicy, heavy foods. If nausea and/or vomiting occur, drink only clear liquids until the nausea and/or vomiting subsides. Call your physician if vomiting continues.  Special Instructions/Symptoms: Your throat may feel dry or sore from the  anesthesia or the breathing tube placed in your throat during surgery. If this causes discomfort, gargle with warm salt water. The discomfort should disappear within 24 hours.  If you had a scopolamine patch placed behind your ear for the management of post- operative nausea and/or vomiting:  1. The medication in the patch is effective for 72 hours, after which it should be removed.  Wrap patch in a tissue and discard in the trash. Wash hands thoroughly with soap and water. 2. You may remove the patch earlier than 72 hours if you experience unpleasant side effects which may include dry mouth, dizziness or visual disturbances. 3. Avoid touching the patch. Wash your hands with soap and water after contact with the patch.

## 2015-04-16 NOTE — Anesthesia Preprocedure Evaluation (Signed)
Anesthesia Evaluation  Patient identified by MRN, date of birth, ID band Patient awake    Reviewed: Allergy & Precautions, NPO status , Patient's Chart, lab work & pertinent test results  Airway Mallampati: I  TM Distance: >3 FB Neck ROM: Full    Dental  (+) Teeth Intact, Dental Advisory Given   Pulmonary former smoker,  breath sounds clear to auscultation        Cardiovascular Rhythm:Regular Rate:Normal     Neuro/Psych    GI/Hepatic   Endo/Other    Renal/GU      Musculoskeletal   Abdominal   Peds  Hematology   Anesthesia Other Findings   Reproductive/Obstetrics                             Anesthesia Physical Anesthesia Plan  ASA: I  Anesthesia Plan: General   Post-op Pain Management:    Induction: Intravenous  Airway Management Planned: LMA  Additional Equipment:   Intra-op Plan:   Post-operative Plan: Extubation in OR  Informed Consent: I have reviewed the patients History and Physical, chart, labs and discussed the procedure including the risks, benefits and alternatives for the proposed anesthesia with the patient or authorized representative who has indicated his/her understanding and acceptance.   Dental advisory given  Plan Discussed with: CRNA, Anesthesiologist and Surgeon  Anesthesia Plan Comments:        Anesthesia Quick Evaluation  

## 2015-04-16 NOTE — Op Note (Signed)
04/16/2015  9:59 AM  PATIENT:  Lindsey Martin  34 y.o. female  PRE-OPERATIVE DIAGNOSIS:  Painful hardware at left ulna   POST-OPERATIVE DIAGNOSIS:  Same  PROCEDURE:  Removal of hardware from left ulna   SURGEON: Rayvon Char. Grandville Silos, MD  PHYSICIAN ASSISTANT: Meriel Flavors, OPA-C   ANESTHESIA:  general  SPECIMENS:  None  DRAINS:   None  EBL:  less than 50 mL  PREOPERATIVE INDICATIONS:  [name] is a  34 y.o. female with history of slow healing left ulnar osteotomy, who has subsequently fallen and had a nondisplaced fracture at the distal end plate. She has pain about the hardware, and is concerned regarding possible allergic reaction as well. She desires hardware be removed.   The risks benefits and alternatives were discussed with the patient preoperatively including but not limited to the risks of infection, bleeding, nerve injury, cardiopulmonary complications, the need for revision surgery, among others, and the patient verbalized understanding and consented to proceed.  OPERATIVE IMPLANTS: Removed plates and screws.   OPERATIVE PROCEDURE:  After receiving prophylactic antibiotics, the patient was escorted to the operative theatre and placed in a supine position. General anesthesia was administered.  A surgical "time-out" was performed during which the planned procedure, proposed operative site, and the correct patient identity were compared to the operative consent and agreement confirmed by the circulating nurse according to current facility policy.  Following application of a tourniquet to the operative extremity, the exposed skin was prepped with Chloraprep and draped in the usual sterile fashion.  The limb was exsanguinated with an Esmarch bandage and the tourniquet inflated to approximately 125mmHg higher than systolic BP.  A portion of the previous ulnar incision was made sharply with a scalpel. Subcutaneous tissues were dissected with sharp dissection down the  fascia, which was then split between the FCU and ECG. Subperiosteal dissection was carried along the ulnar side of the ulna, down to the edge of the plate, and then up over the plate volarly to expose it. Overall the incision was limited and each and stretched to accommodate removal without re-creating the same exposure. The screws were removed, plate was removed, and the membrane in the screw holes around the plate was debrided with curettes and rongeurs, and although it did not appear grossly infected, some was sent for culture.  It appeared that the osteotomy had healed completely, has had distal fracture at the distal margin of the plate, as was no gross movement at either site. Tourniquet was released the wound irrigated and decision made to allow the fascia to remain open. The skin was closed with 2-0 Vicryl deep dermal buried sutures and a running 4-0 Vicryl Rapide suture. Half percent Marcaine with epinephrine was instilled around the skin incision help postoperative pain control. Short arm splint dressing was applied and she was awakened and taken to the recovery room in stable condition, breathing spontaneously

## 2015-04-16 NOTE — Transfer of Care (Signed)
Immediate Anesthesia Transfer of Care Note  Patient: Lindsey Martin  Procedure(s) Performed: Procedure(s): REMOVAL OF RIGHT ULNA PLATE (Right)  Patient Location: PACU  Anesthesia Type:General  Level of Consciousness: sedated, patient cooperative and lethargic  Airway & Oxygen Therapy: Patient Spontanous Breathing and Patient connected to face mask oxygen  Post-op Assessment: Report given to RN and Post -op Vital signs reviewed and stable  Post vital signs: Reviewed and stable  Last Vitals:  Filed Vitals:   04/16/15 0823  BP: 116/85  Pulse: 86  Temp: 36.8 C  Resp: 20    Complications: No apparent anesthesia complications

## 2015-04-16 NOTE — Interval H&P Note (Signed)
History and Physical Interval Note:  04/16/2015 9:57 AM  Lindsey Martin  has presented today for surgery, with the diagnosis of PAINFUL HARDWARE RIGHT FOREARM  The various methods of treatment have been discussed with the patient and family. After consideration of risks, benefits and other options for treatment, the patient has consented to  Procedure(s): REMOVAL OF RIGHT ULNA PLATE (Right) as a surgical intervention .  The patient's history has been reviewed, patient examined, no change in status, stable for surgery.  I have reviewed the patient's chart and labs.  Questions were answered to the patient's satisfaction.     Lindsey Martin A.

## 2015-04-17 ENCOUNTER — Encounter (HOSPITAL_BASED_OUTPATIENT_CLINIC_OR_DEPARTMENT_OTHER): Payer: Self-pay | Admitting: Orthopedic Surgery

## 2015-10-19 ENCOUNTER — Ambulatory Visit (INDEPENDENT_AMBULATORY_CARE_PROVIDER_SITE_OTHER): Payer: Self-pay | Admitting: Family Medicine

## 2015-10-19 DIAGNOSIS — Z5329 Procedure and treatment not carried out because of patient's decision for other reasons: Secondary | ICD-10-CM

## 2015-10-19 NOTE — Progress Notes (Signed)
No show first visit

## 2016-05-02 IMAGING — DX DG WRIST COMPLETE 3+V*R*
4 series · 4 of 4 positions shown · non-contrast
Comparison: None.

CLINICAL DATA: Fell down the basement stairs tonight.

EXAM:
RIGHT WRIST - COMPLETE 3+ VIEW

[wrist pa]
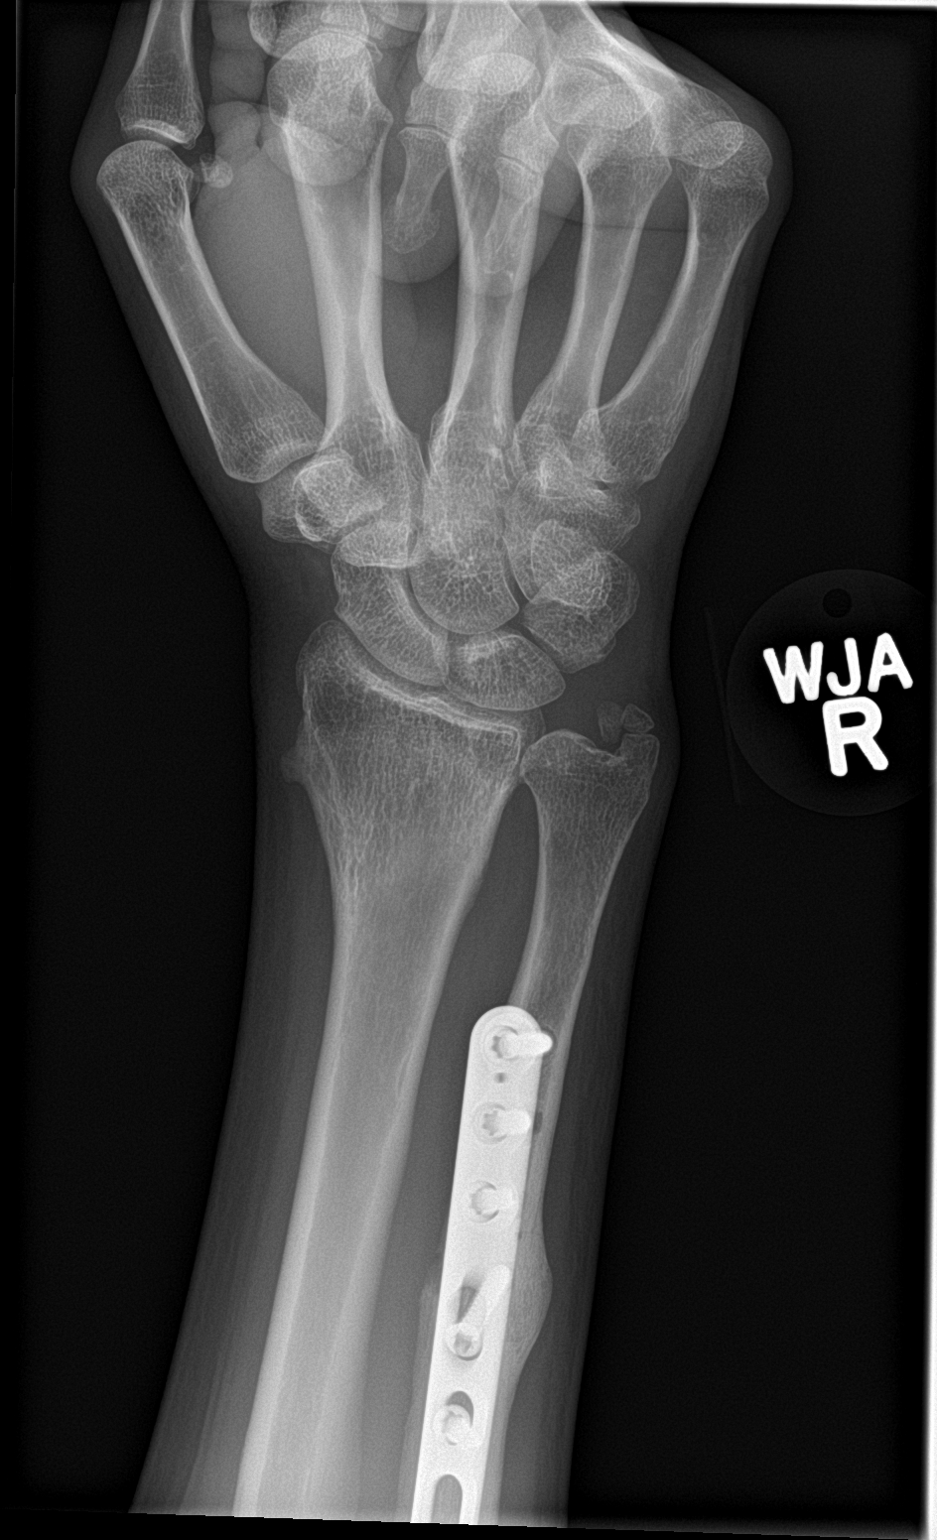

[wrist obl]
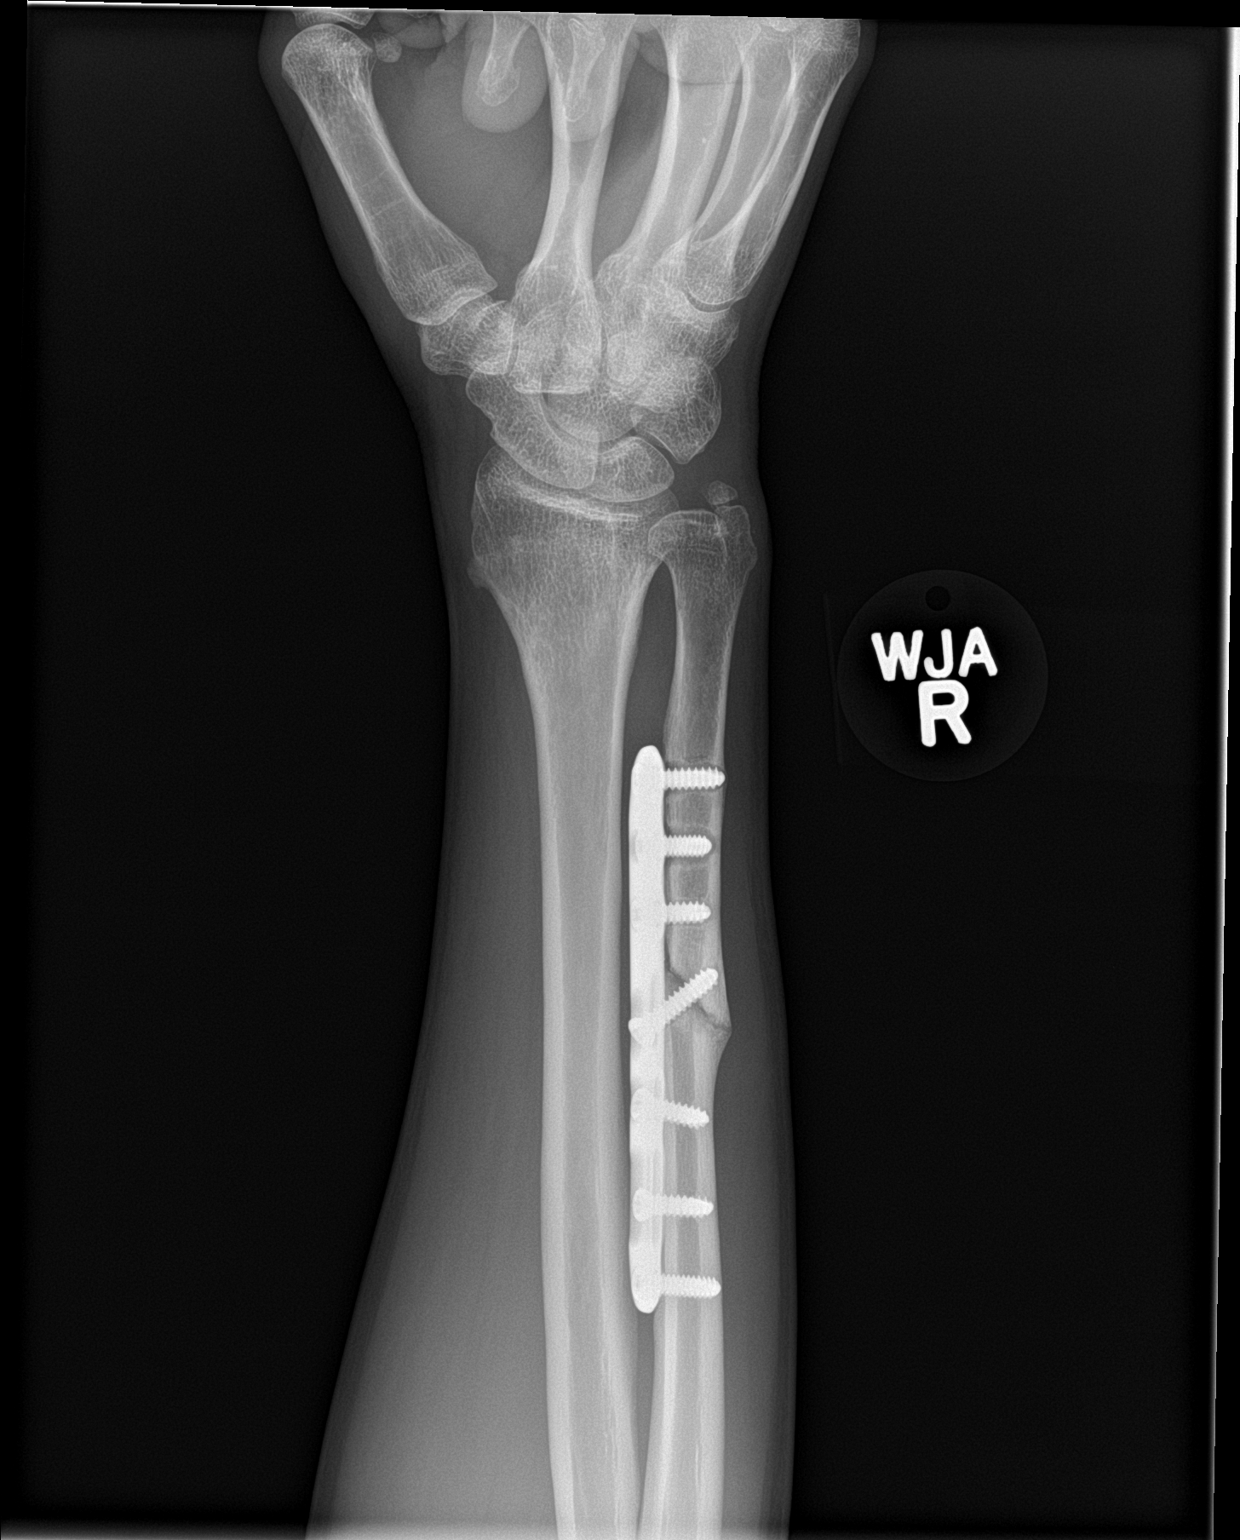

[wrist lat]
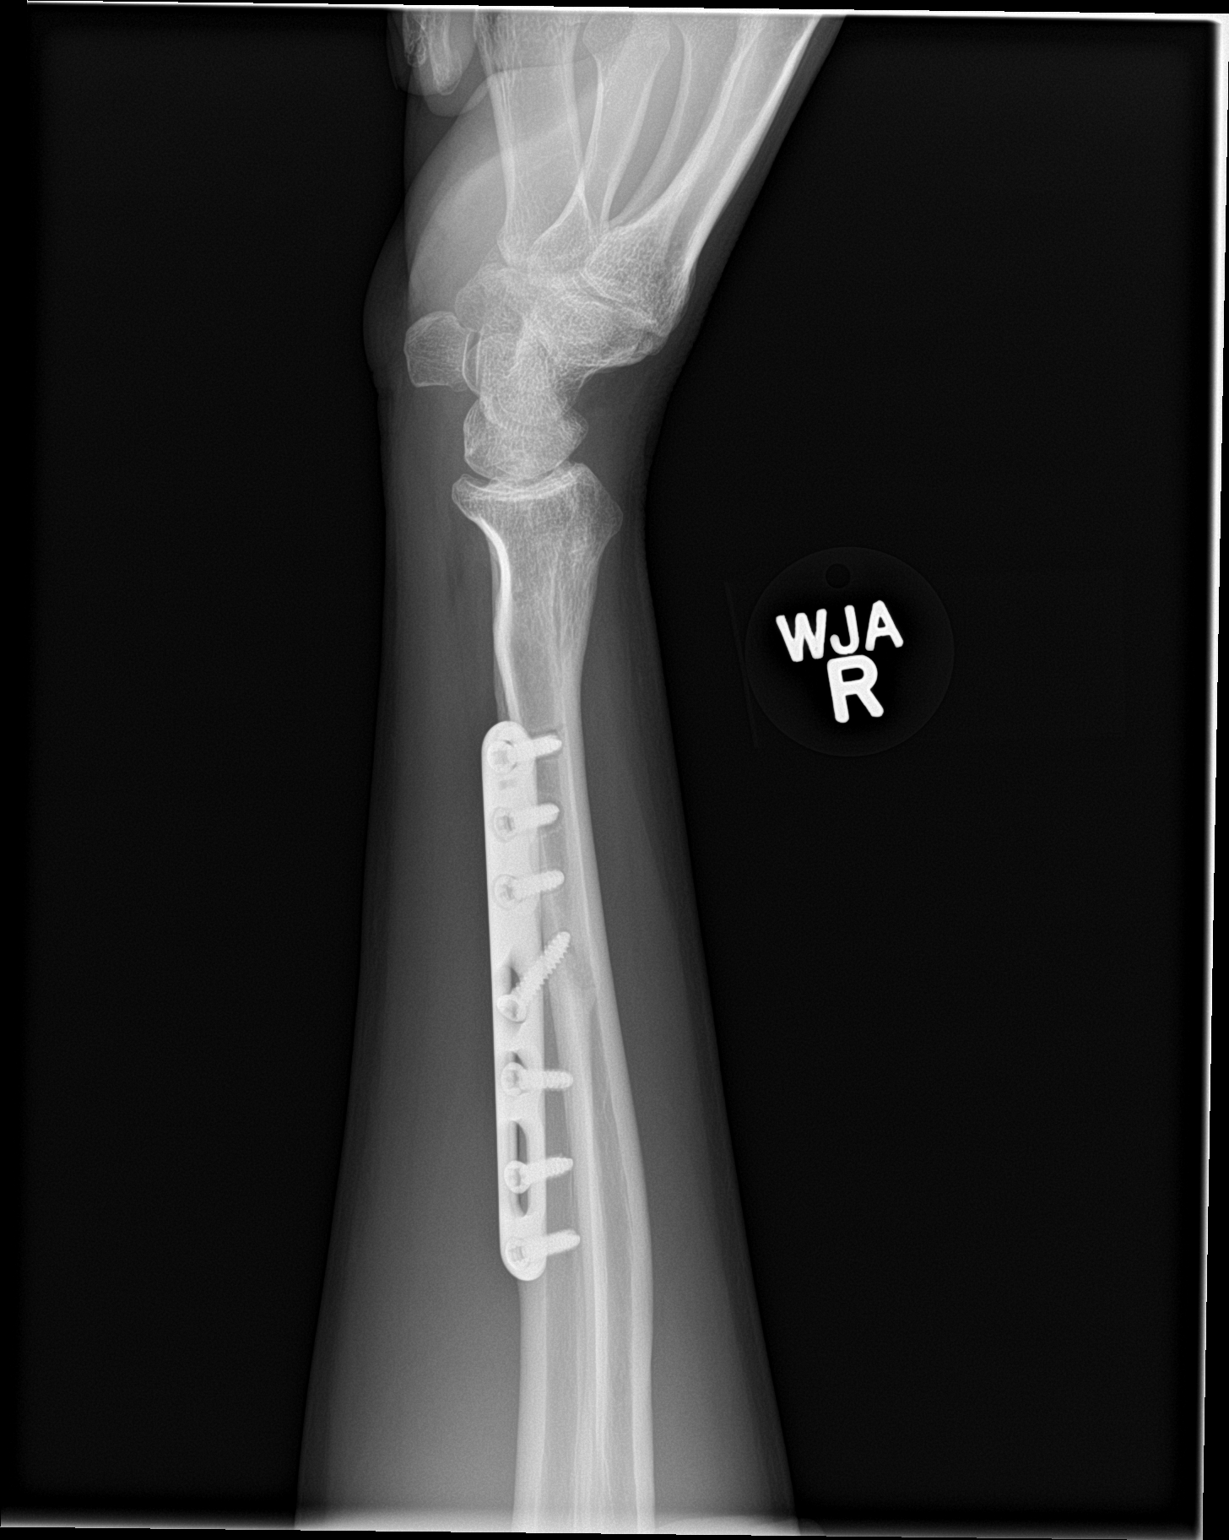

[wrist navicular]
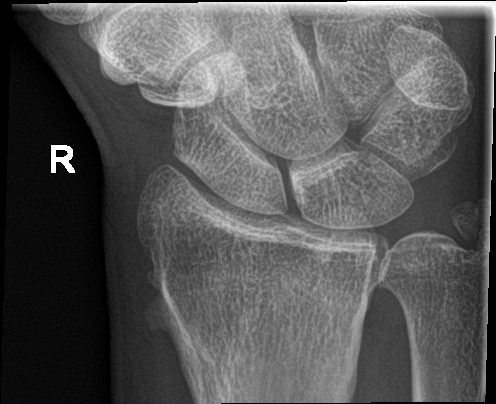

[4 of 4 positions shown; findings below may reference images not displayed]

FINDINGS: Negative for fracture, dislocation or radiopaque foreign body. There
is volar plate screw fixation of the distal fibular diaphysis. There
are old ulnar styloid fragments.
IMPRESSION: Negative for acute fracture

## 2017-03-07 ENCOUNTER — Emergency Department (INDEPENDENT_AMBULATORY_CARE_PROVIDER_SITE_OTHER): Payer: Self-pay

## 2017-03-07 ENCOUNTER — Emergency Department (INDEPENDENT_AMBULATORY_CARE_PROVIDER_SITE_OTHER)
Admission: EM | Admit: 2017-03-07 | Discharge: 2017-03-07 | Disposition: A | Discharge status: Home / Self Care | Payer: Self-pay | Source: Home / Self Care | Attending: Family Medicine | Admitting: Family Medicine

## 2017-03-07 ENCOUNTER — Encounter: Payer: Self-pay | Admitting: Emergency Medicine

## 2017-03-07 DIAGNOSIS — S63634A Sprain of interphalangeal joint of right ring finger, initial encounter: Secondary | ICD-10-CM

## 2017-03-07 DIAGNOSIS — M79631 Pain in right forearm: Secondary | ICD-10-CM

## 2017-03-07 DIAGNOSIS — S63501A Unspecified sprain of right wrist, initial encounter: Secondary | ICD-10-CM

## 2017-03-07 DIAGNOSIS — M79641 Pain in right hand: Secondary | ICD-10-CM

## 2017-03-07 DIAGNOSIS — M25531 Pain in right wrist: Secondary | ICD-10-CM

## 2017-03-07 MED ORDER — OXYCODONE-ACETAMINOPHEN 5-325 MG PO TABS: 1.0000 | ORAL_TABLET | Freq: Four times a day (QID) | ORAL | 0 refills | Status: DC | PRN

## 2017-03-07 NOTE — Discharge Instructions (Signed)
Wear wrist splint, and buddy tape sprained finger.  Apply ice pack for 15 to 20 minutes, 3 to 4 times daily  Continue until pain and swelling decrease.  Elevate arm.  May continue Naproxen.

## 2017-03-07 NOTE — ED Provider Notes (Signed)
[facility]    CSN: [id number] Arrival date & time: 03/07/17  1137     History   Chief Complaint Chief Complaint  Patient presents with  . Hand Injury    HPI Lindsey Martin is a 36 y.o. female.   While playing softball yesterday, patient felt a sudden painful popping sensation in her left hand, wrist, and forearm.  She has a past history of fracture in her left arm.   The history is provided by the patient.  Arm Injury  Location:  Wrist, hand and arm Arm location:  L forearm Wrist location:  L wrist Hand location:  L hand Injury: yes   Time since incident:  1 day Mechanism of injury comment:  Playing softball Pain details:    Quality:  Aching   Radiates to:  L upper arm   Severity:  Moderate   Onset quality:  Sudden   Duration:  1 day   Timing:  Constant   Progression:  Unchanged Prior injury to area:  Yes Relieved by:  Nothing Worsened by:  Movement Ineffective treatments:  NSAIDs Associated symptoms: decreased range of motion, stiffness, swelling and tingling   Associated symptoms: no fatigue, no muscle weakness and no numbness     Past Medical History:  Diagnosis Date  . Anxiety   . Psoriasis    elbows, knees, left ankle  . Psoriasis   . Sensitive skin   . Teeth grinding   . TFCC (triangular fibrocartilage complex) tear 06/2014   right  . TMJ locking    right jaw    There are no active problems to display for this patient.   Past Surgical History:  Procedure Laterality Date  . CYST EXCISION Right 11/19/2000   labium majus  . FOREARM FRACTURE SURGERY    . HARDWARE REMOVAL Right 04/16/2015   Procedure: REMOVAL OF RIGHT ULNA PLATE;  Surgeon: Milly Jakob, MD;  Location: [place];  Service: Orthopedics;  Laterality: Right;  . ULNA OSTEOTOMY Right 06/23/2014   Procedure: ULNAR SHORTENING;  Surgeon: Jolyn Nap, MD;  Location: [place];  Service: Orthopedics;  Laterality: Right;  . WISDOM TOOTH  EXTRACTION    . WRIST ARTHROSCOPY Right 06/23/2014   Procedure: ARTHROSCOPY WRIST AND ULNA SHORTENING OSTEOTOMY;  Surgeon: Jolyn Nap, MD;  Location: [place];  Service: Orthopedics;  Laterality: Right;    OB History    Gravida Para Term Preterm AB Living   2 1 1   1 1    SAB TAB Ectopic Multiple Live Births       1           Home Medications    Prior to Admission medications   Medication Sig Start Date End Date Taking? Authorizing Provider  ALPRAZolam Duanne Moron) 0.25 MG tablet Take 0.25 mg by mouth at bedtime as needed for anxiety.   Yes [provider]  zolpidem (AMBIEN) 5 MG tablet Take 5 mg by mouth at bedtime as needed for sleep.   Yes [provider]  oxyCODONE-acetaminophen (ROXICET) 5-325 MG tablet Take 1 tablet by mouth every 6 (six) hours as needed for moderate pain. 03/07/17 03/07/18  Kandra Nicolas, MD    Family History Family History  Problem Relation Age of Onset  . Heart disease Mother        CABG    Social History Social History  Substance Use Topics  . Smoking status: Former Smoker    Packs/day: 0.33  Years: 18.00    Types: Cigarettes  . Smokeless tobacco: Current User     Comment: Vape  . Alcohol use Yes     Allergies   Zithromax [azithromycin]   Review of Systems Review of Systems  Constitutional: Negative for fatigue.  Musculoskeletal: Positive for stiffness.  All other systems reviewed and are negative.    Physical Exam Triage Vital Signs ED Triage Vitals  Enc Vitals Group     BP 03/07/17 1158 128/85     Pulse Rate 03/07/17 1158 98     Resp --      Temp 03/07/17 1158 99 F (37.2 C)     Temp Source 03/07/17 1158 Oral     SpO2 03/07/17 1158 95 %     Weight 03/07/17 1159 126 lb (57.2 kg)     Height 03/07/17 1159 5\' 6"  (1.676 m)     Head Circumference --      Peak Flow --      Pain Score 03/07/17 1159 8     Pain Loc --      Pain Edu? --      Excl. in [place]? --    No data found.   Updated  Vital Signs BP 128/85 (BP Location: Left Arm)   Pulse 98   Temp 99 F (37.2 C) (Oral)   Ht 5\' 6"  (1.676 m)   Wt 126 lb (57.2 kg)   LMP 03/03/2017   SpO2 95%   BMI 20.34 kg/m   Visual Acuity Right Eye Distance:   Left Eye Distance:   Bilateral Distance:    Right Eye Near:   Left Eye Near:    Bilateral Near:     Physical Exam  Constitutional: She appears well-developed and well-nourished. No distress.  HENT:  Head: Normocephalic.  Eyes: Pupils are equal, round, and reactive to light.  Neck: Normal range of motion.  Cardiovascular: Normal rate.   Pulmonary/Chest: Effort normal.  Musculoskeletal:       Right forearm: She exhibits tenderness and bony tenderness.       Arms:      Right hand: She exhibits decreased range of motion and tenderness. She exhibits normal two-point discrimination, normal capillary refill, no deformity, no laceration and no swelling. Normal sensation noted.       Hands: Right hand reveals tenderness over the 4th finger, proximal phalanx as noted on diagram.  Finger has decreased range of motion but flexion/extension is intact.  Right forearm has tenderness to palpation over ulnar aspect as noted on diagram.  Right wrist has decreased range of motion with vague tenderness dorsally.  Neurological: She is alert.  Skin: Skin is warm and dry.  Nursing note and vitals reviewed.    UC Treatments / Results  Labs (all labs ordered are listed, but only abnormal results are displayed) Labs Reviewed - No data to display  EKG  EKG Interpretation None       Radiology Dg Forearm Right  Result Date: 03/07/2017 CLINICAL DATA:  Acute right forearm pain following fall last night. Initial encounter. EXAM: RIGHT FOREARM - 2 VIEW COMPARISON:  None. FINDINGS: There is no evidence of acute fracture, subluxation or dislocation. Remote fractures of the distal radius and ulna are noted. No suspicious focal bony abnormalities are present. IMPRESSION: No acute  abnormality. Electronically Signed   By: Margarette Canada M.D.   On: 03/07/2017 12:27   Dg Wrist Complete Right  Result Date: 03/07/2017 CLINICAL DATA:  Acute right wrist pain following fall last  night. Initial encounter. EXAM: RIGHT WRIST - COMPLETE 3+ VIEW COMPARISON:  04/06/2015 and prior radiographs FINDINGS: There is no evidence of acute fracture, subluxation or dislocation. Remote fractures of the distal radius and ulna and ulnar styloid again noted. No suspicious focal bony lesions are identified. IMPRESSION: No evidence of acute abnormality. Electronically Signed   By: Margarette Canada M.D.   On: 03/07/2017 12:25   Dg Hand Complete Right  Result Date: 03/07/2017 CLINICAL DATA:  Acute right hand pain following injury last night. Initial encounter. EXAM: RIGHT HAND - COMPLETE 3+ VIEW COMPARISON:  None. FINDINGS: No acute fracture, subluxation or dislocation. Remote fractures of the distal radius and ulna noted. Joint spaces are unremarkable. IMPRESSION: No acute abnormality. Electronically Signed   By: Margarette Canada M.D.   On: 03/07/2017 12:26    Procedures Procedures (including critical care time)  Medications Ordered in UC Medications - No data to display   Initial Impression / Assessment and Plan / UC Course  I have reviewed the triage vital signs and the nursing notes.  Pertinent labs & imaging results that were available during my care of the patient were reviewed by me and considered in my medical decision making (see chart for details).    Dispensed thumb spica splint.   Finger strapped using "Buddy Tape" technique.  Rx for Percocet (#12, no refill) Controlled Substance Prescriptions I have consulted the [place] Controlled Substances Registry for this patient, and feel the risk/benefit ratio today is favorable for proceeding with this prescription for a controlled substance.  Wear wrist splint, and buddy tape sprained finger.  Apply ice pack for 15 to 20 minutes, 3 to 4 times daily  Continue  until pain and swelling decrease.  Elevate arm.  May continue Naproxen. Followup with orthopedist.    Final Clinical Impressions(s) / UC Diagnoses   Final diagnoses:  Sprain of right wrist, initial encounter  Sprain of interphalangeal joint of right ring finger, initial encounter    New Prescriptions New Prescriptions   OXYCODONE-ACETAMINOPHEN (ROXICET) 5-325 MG TABLET    Take 1 tablet by mouth every 6 (six) hours as needed for moderate pain.     Kandra Nicolas, MD 03/17/17 [phone]

## 2017-03-07 NOTE — ED Triage Notes (Signed)
Patient presents to [facility] with a complaint of hand and forearm pain, patient states that she feel while playing softball, felt a pop.

## 2017-03-16 ENCOUNTER — Emergency Department (INDEPENDENT_AMBULATORY_CARE_PROVIDER_SITE_OTHER)
Admission: EM | Admit: 2017-03-16 | Discharge: 2017-03-16 | Disposition: A | Discharge status: Home / Self Care | Payer: Self-pay | Source: Home / Self Care | Attending: Family Medicine | Admitting: Family Medicine

## 2017-03-16 ENCOUNTER — Encounter: Payer: Self-pay | Admitting: *Deleted

## 2017-03-16 DIAGNOSIS — S91312A Laceration without foreign body, left foot, initial encounter: Secondary | ICD-10-CM

## 2017-03-16 DIAGNOSIS — Z23 Encounter for immunization: Secondary | ICD-10-CM

## 2017-03-16 MED ORDER — MUPIROCIN 2 % EX OINT: TOPICAL_OINTMENT | CUTANEOUS | 0 refills | Status: DC

## 2017-03-16 MED ORDER — CEPHALEXIN 500 MG PO CAPS: 500.0000 mg | ORAL_CAPSULE | Freq: Two times a day (BID) | ORAL | 0 refills | Status: DC

## 2017-03-16 MED ORDER — OXYCODONE-ACETAMINOPHEN 5-325 MG PO TABS: 1.0000 | ORAL_TABLET | Freq: Four times a day (QID) | ORAL | 0 refills | Status: DC | PRN

## 2017-03-16 MED ORDER — TETANUS-DIPHTH-ACELL PERTUSSIS 5-2.5-18.5 LF-MCG/0.5 IM SUSP
0.5000 mL | Freq: Once | INTRAMUSCULAR | Status: AC
  Administered 2017-03-16: 0.5 mL via INTRAMUSCULAR

## 2017-03-16 NOTE — Discharge Instructions (Signed)
Change dressing daily and apply Mupirocin ointment to wound.  Keep wound clean and dry.  Return for any signs of infection (or follow-up with family doctor):  Increasing redness, swelling, pain, heat, drainage, etc. Return in 10 days for suture removal.   May take Ibuprofen 200mg , 4 tabs every 8 hours with food.

## 2017-03-16 NOTE — ED Provider Notes (Signed)
[name]    CSN: [id number] Arrival date & time: 03/16/17  1658     History   Chief Complaint Chief Complaint  Patient presents with  . Foot Pain    HPI Lindsey Martin is a 36 y.o. female.   Patient stepped on a ground spike 1.5 hours ago, resulting in a laceration on the side of her left foot.  She does not remember her last Tdap.   The history is provided by the patient.  Laceration  Location:  Foot Foot laceration location:  L foot Length:  3cm Depth:  Through dermis Quality: straight   Bleeding: controlled   Time since incident:  90 minutes Laceration mechanism:  Metal edge Pain details:    Quality:  Aching   Severity:  Mild   Timing:  Constant   Progression:  Unchanged Foreign body present:  No foreign bodies Relieved by:  Nothing Exacerbated by: walking. Ineffective treatments:  None tried Tetanus status:  Out of date Associated symptoms: no numbness and no swelling     Past Medical History:  Diagnosis Date  . Anxiety   . Psoriasis    elbows, knees, left ankle  . Psoriasis   . Sensitive skin   . Teeth grinding   . TFCC (triangular fibrocartilage complex) tear 06/2014   right  . TMJ locking    right jaw    There are no active problems to display for this patient.   Past Surgical History:  Procedure Laterality Date  . CYST EXCISION Right 11/19/2000   labium majus  . FOREARM FRACTURE SURGERY    . HARDWARE REMOVAL Right 04/16/2015   Procedure: REMOVAL OF RIGHT ULNA PLATE;  Surgeon: Milly Jakob, MD;  Location: [place];  Service: Orthopedics;  Laterality: Right;  . ULNA OSTEOTOMY Right 06/23/2014   Procedure: ULNAR SHORTENING;  Surgeon: Jolyn Nap, MD;  Location: [place];  Service: Orthopedics;  Laterality: Right;  . WISDOM TOOTH EXTRACTION    . WRIST ARTHROSCOPY Right 06/23/2014   Procedure: ARTHROSCOPY WRIST AND ULNA SHORTENING OSTEOTOMY;  Surgeon: Jolyn Nap, MD;  Location:  [place];  Service: Orthopedics;  Laterality: Right;    OB History    Gravida Para Term Preterm AB Living   2 1 1   1 1    SAB TAB Ectopic Multiple Live Births       1           Home Medications    Prior to Admission medications   Medication Sig Start Date End Date Taking? Authorizing Provider  ALPRAZolam Duanne Moron) 0.25 MG tablet Take 0.25 mg by mouth at bedtime as needed for anxiety.    [provider]  cephALEXin (KEFLEX) 500 MG capsule Take 1 capsule (500 mg total) by mouth 2 (two) times daily. 03/16/17   Kandra Nicolas, MD  mupirocin ointment (BACTROBAN) 2 % Apply to wound 2 to 3 times daily with each dressing change. 03/16/17   Kandra Nicolas, MD  oxyCODONE-acetaminophen (ROXICET) 5-325 MG tablet Take 1 tablet by mouth every 6 (six) hours as needed for moderate pain. 03/16/17 03/16/18  Kandra Nicolas, MD  zolpidem (AMBIEN) 5 MG tablet Take 5 mg by mouth at bedtime as needed for sleep.    [provider]    Family History Family History  Problem Relation Age of Onset  . Heart disease Mother        CABG    Social History Social History  Substance Use Topics  . Smoking status: Former Smoker    Packs/day: 0.33    Years: 18.00    Types: Cigarettes  . Smokeless tobacco: Current User     Comment: Vape  . Alcohol use Yes     Allergies   Zithromax [azithromycin] and Hydrocodone   Review of Systems Review of Systems  All other systems reviewed and are negative.    Physical Exam Triage Vital Signs ED Triage Vitals  Enc Vitals Group     BP 03/16/17 1730 (!) 161/97     Pulse Rate 03/16/17 1730 69     Resp 03/16/17 1730 16     Temp 03/16/17 1730 98.2 F (36.8 C)     Temp Source 03/16/17 1730 Oral     SpO2 03/16/17 1730 99 %     Weight 03/16/17 1731 130 lb (59 kg)     Height 03/16/17 1731 5\' 6"  (1.676 m)     Head Circumference --      Peak Flow --      Pain Score 03/16/17 1732 7     Pain Loc --      Pain Edu? --      Excl.  in [place]? --    No data found.   Updated Vital Signs BP (!) 161/97 (BP Location: Left Arm)   Pulse 69   Temp 98.2 F (36.8 C) (Oral)   Resp 16   Ht 5\' 6"  (1.676 m)   Wt 130 lb (59 kg)   LMP 02/04/2017   SpO2 99%   BMI 20.98 kg/m   Visual Acuity Right Eye Distance:   Left Eye Distance:   Bilateral Distance:    Right Eye Near:   Left Eye Near:    Bilateral Near:     Physical Exam  Constitutional: She appears well-developed and well-nourished. No distress.  HENT:  Head: Atraumatic.  Eyes: Pupils are equal, round, and reactive to light.  Cardiovascular: Normal rate.   Pulmonary/Chest: Effort normal.  Musculoskeletal: She exhibits no edema.       Left foot: There is tenderness and laceration. There is normal range of motion, no bony tenderness, no swelling and normal capillary refill.       Feet:  Medial aspect of left foot has a simple superficial 3cm long laceration as noted on diagram.    Neurological: She is alert.  Skin: Skin is warm and dry.  Nursing note and vitals reviewed.    UC Treatments / Results  Labs (all labs ordered are listed, but only abnormal results are displayed) Labs Reviewed - No data to display  EKG  EKG Interpretation None       Radiology No results found.  Procedures Procedures  Laceration Repair Discussed benefits and risks of procedure and verbal consent obtained. Using sterile technique and local anesthesia with 1% lidocaine without epinephrine, cleansed wound with Betadine followed by copious lavage with normal saline.  Wound carefully inspected for debris and foreign bodies; none found.  Wound closed with #6, 4-0 interrupted nylon sutures.  Bacitracin and non-stick sterile dressing applied.  Wound precautions explained to patient.  Return for suture removal in 10 days.   Medications Ordered in UC Medications  Tdap (BOOSTRIX) injection 0.5 mL (0.5 mLs Intramuscular Given 03/16/17 1733)     Initial Impression / Assessment and  Plan / UC Course  I have reviewed the triage vital signs and the nursing notes.  Pertinent labs & imaging results that were available during my care of  the patient were reviewed by me and considered in my medical decision making (see chart for details).    Administered Tdap Begin Keflex 500mg  BID, and topical Bactroban ointment. Rx for Percocet. Controlled Substance Prescriptions I have consulted the [place] Controlled Substances Registry for this patient, and feel the risk/benefit ratio today is favorable for proceeding with this prescription for a controlled substance.   Change dressing daily and apply Mupirocin ointment to wound.  Keep wound clean and dry.  Return for any signs of infection (or follow-up with family doctor):  Increasing redness, swelling, pain, heat, drainage, etc. Return in 10 days for suture removal.   May take Ibuprofen 200mg , 4 tabs every 8 hours with food.     Final Clinical Impressions(s) / UC Diagnoses   Final diagnoses:  Laceration of skin of left foot, initial encounter    New Prescriptions Discharge Medication List as of 03/16/2017  5:59 PM    START taking these medications   Details  cephALEXin (KEFLEX) 500 MG capsule Take 1 capsule (500 mg total) by mouth 2 (two) times daily., Starting Mon 03/16/2017, Print    mupirocin ointment (BACTROBAN) 2 % Apply to wound 2 to 3 times daily with each dressing change., Print            Kandra Nicolas, MD 03/25/17 1008

## 2017-03-16 NOTE — ED Triage Notes (Signed)
Pt c/o LT foot pain and laceration x 1600 today after stepping on a spike in the yard at home. No OTC meds. Tdap unknown.

## 2017-05-27 ENCOUNTER — Emergency Department (INDEPENDENT_AMBULATORY_CARE_PROVIDER_SITE_OTHER)
Admission: EM | Admit: 2017-05-27 | Discharge: 2017-05-27 | Discharge status: Against Medical Advice | Payer: Self-pay | Source: Home / Self Care | Attending: Family Medicine | Admitting: Family Medicine

## 2017-05-27 ENCOUNTER — Encounter: Payer: Self-pay | Admitting: *Deleted

## 2017-05-27 ENCOUNTER — Emergency Department (INDEPENDENT_AMBULATORY_CARE_PROVIDER_SITE_OTHER): Payer: Self-pay

## 2017-05-27 ENCOUNTER — Other Ambulatory Visit: Payer: Self-pay

## 2017-05-27 DIAGNOSIS — R102 Pelvic and perineal pain: Secondary | ICD-10-CM

## 2017-05-27 DIAGNOSIS — R1032 Left lower quadrant pain: Secondary | ICD-10-CM

## 2017-05-27 LAB — POCT URINALYSIS DIP (MANUAL ENTRY)
Bilirubin, UA: NEGATIVE
Blood, UA: NEGATIVE
Glucose, UA: NEGATIVE mg/dL
Ketones, POC UA: NEGATIVE mg/dL
Leukocytes, UA: NEGATIVE
Nitrite, UA: NEGATIVE
Protein Ur, POC: NEGATIVE mg/dL
Spec Grav, UA: >=1.03 — AB (ref 1.010–1.025)
Urobilinogen, UA: 0.2 E.U./dL
pH, UA: 5.5 (ref 5.0–8.0)

## 2017-05-27 LAB — POCT CBC W AUTO DIFF (K'VILLE URGENT CARE)

## 2017-05-27 LAB — POCT URINE PREGNANCY: Preg Test, Ur: NEGATIVE

## 2017-05-27 MED ORDER — ONDANSETRON HCL 4 MG/2ML IJ SOLN
4.0000 mg | Freq: Once | INTRAMUSCULAR | Status: AC
  Administered 2017-05-27: 4 mg via INTRAMUSCULAR

## 2017-05-27 NOTE — ED Provider Notes (Signed)
[name]    CSN: [id number] Arrival date & time: 05/27/17  6213     History   Chief Complaint Chief Complaint  Patient presents with  . Abdominal Pain    HPI Lindsey Martin is a 36 y.o. female.   HPI  Lindsey Martin is a 36 y.o. female presenting to UC with c/o gradually worsening LLQ abdominal pain with nausea and vomiting for 3 days.  Pain is 8/10, sharp and stabbing at times.  Pt concerned for possible tubal pregnancy but denies hx of prior ectopic pregnancy.  She has been pregnant once in the past, normal pregnancy 17 years ago.  She has been on the same birth control since giving birth to her son and has never had a pregnancy scare in the past but her LMP was 04/10/17.  Pt states is very regular.  Denies hx of abdominal surgeries.  No urinary symptoms or vaginal symptoms.  No prior hx of diverticulitis or kidney stones. She has not tried anything for symptoms yet. Denies fever or diarrhea.     Past Medical History:  Diagnosis Date  . Anxiety   . Psoriasis    elbows, knees, left ankle  . Psoriasis   . Sensitive skin   . Teeth grinding   . TFCC (triangular fibrocartilage complex) tear 06/2014   right  . TMJ locking    right jaw    There are no active problems to display for this patient.   Past Surgical History:  Procedure Laterality Date  . CYST EXCISION Right 11/19/2000   labium majus  . FOREARM FRACTURE SURGERY    . HARDWARE REMOVAL Right 04/16/2015   Procedure: REMOVAL OF RIGHT ULNA PLATE;  Surgeon: Milly Jakob, MD;  Location: [facility];  Service: Orthopedics;  Laterality: Right;  . ULNA OSTEOTOMY Right 06/23/2014   Procedure: ULNAR SHORTENING;  Surgeon: Jolyn Nap, MD;  Location: [facility];  Service: Orthopedics;  Laterality: Right;  . WISDOM TOOTH EXTRACTION    . WRIST ARTHROSCOPY Right 06/23/2014   Procedure: ARTHROSCOPY WRIST AND ULNA SHORTENING OSTEOTOMY;  Surgeon: Jolyn Nap, MD;   Location: [facility];  Service: Orthopedics;  Laterality: Right;    OB History    Gravida Para Term Preterm AB Living   2 1 1   1 1    SAB TAB Ectopic Multiple Live Births       1           Home Medications    Prior to Admission medications   Medication Sig Start Date End Date Taking? Authorizing Provider  ALPRAZolam Duanne Moron) 0.25 MG tablet Take 0.25 mg by mouth at bedtime as needed for anxiety.    [provider]  zolpidem (AMBIEN) 5 MG tablet Take 5 mg by mouth at bedtime as needed for sleep.    [provider]    Family History Family History  Problem Relation Age of Onset  . Heart disease Mother        CABG    Social History Social History  Substance Use Topics  . Smoking status: Current Every Day Smoker    Packs/day: 0.33    Years: 18.00    Types: Cigarettes  . Smokeless tobacco: Current User     Comment: Vape  . Alcohol use No     Allergies   Zithromax [azithromycin] and Hydrocodone   Review of Systems Review of Systems  Constitutional: Negative for chills and fever.  HENT: Negative  for congestion, ear pain, sore throat, trouble swallowing and voice change.   Respiratory: Negative for cough and shortness of breath.   Cardiovascular: Negative for chest pain and palpitations.  Gastrointestinal: Positive for abdominal pain, nausea and vomiting. Negative for diarrhea.  Genitourinary: Negative for dysuria, flank pain, frequency, hematuria and urgency.  Musculoskeletal: Negative for arthralgias, back pain and myalgias.  Skin: Negative for rash.     Physical Exam Triage Vital Signs ED Triage Vitals  Enc Vitals Group     BP 05/27/17 0948 (!) 185/127     Pulse Rate 05/27/17 0948 (!) 106     Resp 05/27/17 0948 18     Temp 05/27/17 0948 97.9 F (36.6 C)     Temp Source 05/27/17 0948 Oral     SpO2 05/27/17 0948 99 %     Weight 05/27/17 0949 125 lb (56.7 kg)     Height 05/27/17 0949 5\' 6"  (1.676 m)     Head Circumference --       Peak Flow --      Pain Score 05/27/17 0949 8     Pain Loc --      Pain Edu? --      Excl. in [place]? --    No data found.   Updated Vital Signs BP (!) 185/127 (BP Location: Left Arm)   Pulse (!) 106   Temp 97.9 F (36.6 C) (Oral)   Resp 18   Ht 5\' 6"  (1.676 m)   Wt 125 lb (56.7 kg)   LMP 04/10/2017   SpO2 99%   BMI 20.18 kg/m   Visual Acuity Right Eye Distance:   Left Eye Distance:   Bilateral Distance:    Right Eye Near:   Left Eye Near:    Bilateral Near:     Physical Exam  Constitutional: She is oriented to person, place, and time. She appears well-developed and well-nourished. She appears distressed.  Pt lying on exam bed holding Left lower abdomen, appears uncomfortable but is alert, cooperative and non-toxic.  HENT:  Head: Normocephalic and atraumatic.  Mouth/Throat: Oropharynx is clear and moist.  Eyes: EOM are normal.  Neck: Normal range of motion. Neck supple.  Cardiovascular: Normal rate and regular rhythm.   Pulmonary/Chest: Effort normal and breath sounds normal. No respiratory distress. She has no wheezes. She has no rales.  Abdominal: Soft. Bowel sounds are normal. She exhibits no distension and no mass. There is tenderness. There is guarding. There is no rebound and no CVA tenderness.    Musculoskeletal: Normal range of motion.  Neurological: She is alert and oriented to person, place, and time.  Skin: Skin is warm and dry. She is not diaphoretic.  Psychiatric: She has a normal mood and affect. Her behavior is normal.  Nursing note and vitals reviewed.    UC Treatments / Results  Labs (all labs ordered are listed, but only abnormal results are displayed) Labs Reviewed  POCT URINALYSIS DIP (MANUAL ENTRY) - Abnormal; Notable for the following:       Result Value   Spec Grav, UA >=1.030 (*)    All other components within normal limits  POCT URINE PREGNANCY  POCT CBC W AUTO DIFF ([place])    EKG  EKG Interpretation None         Radiology US Pelvic Complete W Transvaginal And Torsion R/o  Result Date: 05/27/2017 CLINICAL DATA:  Left pelvic pain for 3 days. Negative pregnancy test. Clinical suspicion for ovarian torsion. EXAM: TRANSABDOMINAL AND TRANSVAGINAL  ULTRASOUND OF PELVIS DOPPLER ULTRASOUND OF OVARIES TECHNIQUE: Both transabdominal and transvaginal ultrasound examinations of the pelvis were performed. Transabdominal technique was performed for global imaging of the pelvis including uterus, ovaries, adnexal regions, and pelvic cul-de-sac. It was necessary to proceed with endovaginal exam following the transabdominal exam to visualize the endometrium and ovaries. Color and duplex Doppler ultrasound was utilized to evaluate blood flow to the ovaries. COMPARISON:  None. FINDINGS: Uterus Measurements: 8.8 x 4.0 x 4.9 cm. No fibroids or other mass visualized. Endometrium Thickness: 14 mm.  No focal abnormality visualized. Right ovary Measurements: 2.8 x 1.7 x 2.3 cm. Normal appearance/no adnexal mass. Left ovary Measurements: 3.1 x 1.7 x 2.4 cm. Normal appearance/no adnexal mass. Pulsed Doppler evaluation of both ovaries demonstrates normal low-resistance arterial and venous waveforms. Other findings No abnormal free fluid. IMPRESSION: Normal appearance of uterus and ovaries. No pelvic mass or other significant abnormality identified. No Doppler signs of ovarian torsion. Electronically Signed   By: Earle Gell M.D.   On: 05/27/2017 11:37    Procedures Procedures (including critical care time)  Medications Ordered in UC Medications  ondansetron (ZOFRAN) injection 4 mg (4 mg Intramuscular Given 05/27/17 1012)     Initial Impression / Assessment and Plan / UC Course  I have reviewed the triage vital signs and the nursing notes.  Pertinent labs & imaging results that were available during my care of the patient were reviewed by me and considered in my medical decision making (see chart for details).     Urine  pregnancy- Negative, but pt states her pregnancy tests "never" come up positive, it only shows in her CBC as an elevated WBC  WBC- 16 Pelvic U/S performed to r/o ectopic pregnancy (not likely due to negative urine pregnancy 3 weeks late), ovarian torsion, cyst or fibroid  Pt was given 4mg  Zofran IM. Was feeling moderately improved. Able to keep down crackers and a few ounces of ginger-ale.  While waiting on test results pt requested Mallie Snooks, RN to speak with her parole officer on pt's cell phone to confirm pt was at our facility.    Pelvic U/S: Normal  Recommend CT abdomen w/ contrast for potential diverticulitis vs abdominal abscess vs other urgent/emergent cause of symptoms.   Pt agreeable with CT scan.   12:19 PM Pt appears to have eloped per Charna Archer, LMP.  Pt went out front to put her bag in her car but never returned.  Pt left CT PO contrast in her room.    12:52 PM Pt has still not returned to UC.  Pt appears to have eloped. Checked hallways and parking lot for pt w/o signs of pt. Staff believes they saw her ride off in a car with a female when she first went outside around 12:15PM    Final Clinical Impressions(s) / UC Diagnoses   Final diagnoses:  Acute pelvic pain, female  Abdominal pain, left lower quadrant    New Prescriptions New Prescriptions   No medications on file     Controlled Substance Prescriptions [place] Controlled Substance Registry consulted? Not Applicable   Tyrell Antonio 05/27/17 1252

## 2017-05-27 NOTE — ED Triage Notes (Signed)
Pt c/o LLQ abd pain with nausea and vomiting x 3 days. Denies fever.

## 2017-05-27 NOTE — ED Notes (Signed)
Pt called back told receptionist that she had to leave because her ride couldn't stay any longer and that her grandmother was going to bring her back now to finish her visit.

## 2017-05-27 NOTE — ED Notes (Signed)
Pt left without notifying the nurse. She told receptionist that she was going to her car to take her bag and she would be right back. I called the pt at 12:20pm and left a voicemail inquiring about where she was at.

## 2017-05-27 NOTE — ED Notes (Signed)
Called LM for pt to call back and to let her know she would need to be back by 1500 today if she would like to get a CT here to allow time for her to drink the contrast, otherwise she would need to go to the ED for her scan.

## 2017-05-28 ENCOUNTER — Emergency Department
Admission: EM | Admit: 2017-05-28 | Discharge: 2017-05-28 | Disposition: A | Discharge status: Home / Self Care | Payer: Self-pay | Source: Home / Self Care | Attending: Family Medicine | Admitting: Family Medicine

## 2017-05-28 ENCOUNTER — Encounter: Payer: Self-pay | Admitting: Emergency Medicine

## 2017-05-28 DIAGNOSIS — R102 Pelvic and perineal pain: Secondary | ICD-10-CM

## 2017-05-28 MED ORDER — CEFTRIAXONE SODIUM 250 MG IJ SOLR: 250.0000 mg | Freq: Once | INTRAMUSCULAR | Status: DC

## 2017-05-28 MED ORDER — DOXYCYCLINE HYCLATE 100 MG PO CAPS: 100.0000 mg | ORAL_CAPSULE | Freq: Two times a day (BID) | ORAL | 0 refills | Status: DC

## 2017-05-28 MED ORDER — OXYCODONE-ACETAMINOPHEN 5-325 MG PO TABS: ORAL_TABLET | ORAL | 0 refills | Status: DC

## 2017-05-28 NOTE — ED Provider Notes (Signed)
[facility]    CSN: [id number] Arrival date & time: 05/28/17  1735     History   Chief Complaint Chief Complaint  Patient presents with  . Abdominal Pain    HPI Lindsey Martin is a 36 y.o. female.   Patient was evaluated in this clinic yesterday for three day history of lower abdominal pain, nausea, and vomiting.  She was concerned about possible ectopic pregnancy.  CBC yesterday showed WBC 16, and urine pregnancy test was negative.  Pelvic ultrasound was negative.  CT abdomen/pelvis was ordered but patient left clinic without undergoing scan. She returns today complaining of persistent symptoms, although not as severe.  She denies fevers, chills, and sweats, urinary symptoms, and vaginal discharge.  Patient's last menstrual period was 03/28/2017 (approximate).    The history is provided by the patient and a parent.  Abdominal Pain  Pain location:  LLQ Pain quality: stabbing   Pain radiates to:  Does not radiate Pain severity:  Moderate Onset quality:  Gradual Duration:  4 days Timing:  Constant Progression:  Improving Chronicity:  New Context: awakening from sleep   Context: not eating, not previous surgeries and not recent illness   Relieved by:  Nothing Worsened by:  Movement Ineffective treatments:  None tried Associated symptoms: fatigue, nausea and vomiting   Associated symptoms: no anorexia, no chest pain, no chills, no constipation, no cough, no diarrhea, no dysuria, no fever, no hematemesis, no hematochezia, no hematuria, no melena, no shortness of breath, no vaginal bleeding and no vaginal discharge     Past Medical History:  Diagnosis Date  . Anxiety   . Psoriasis    elbows, knees, left ankle  . Psoriasis   . Sensitive skin   . Teeth grinding   . TFCC (triangular fibrocartilage complex) tear 06/2014   right  . TMJ locking    right jaw    There are no active problems to display for this patient.   Past Surgical History:  Procedure  Laterality Date  . CYST EXCISION Right 11/19/2000   labium majus  . FOREARM FRACTURE SURGERY    . HARDWARE REMOVAL Right 04/16/2015   Procedure: REMOVAL OF RIGHT ULNA PLATE;  Surgeon: Milly Jakob, MD;  Location: [facility];  Service: Orthopedics;  Laterality: Right;  . ULNA OSTEOTOMY Right 06/23/2014   Procedure: ULNAR SHORTENING;  Surgeon: Jolyn Nap, MD;  Location: [facility];  Service: Orthopedics;  Laterality: Right;  . WISDOM TOOTH EXTRACTION    . WRIST ARTHROSCOPY Right 06/23/2014   Procedure: ARTHROSCOPY WRIST AND ULNA SHORTENING OSTEOTOMY;  Surgeon: Jolyn Nap, MD;  Location: [facility];  Service: Orthopedics;  Laterality: Right;    OB History    Gravida Para Term Preterm AB Living   2 1 1   1 1    SAB TAB Ectopic Multiple Live Births       1           Home Medications    Prior to Admission medications   Medication Sig Start Date End Date Taking? Authorizing Provider  ALPRAZolam Duanne Moron) 0.25 MG tablet Take 0.25 mg by mouth at bedtime as needed for anxiety.    [provider]  doxycycline (VIBRAMYCIN) 100 MG capsule Take 1 capsule (100 mg total) by mouth 2 (two) times daily. Take with food. 05/28/17   Kandra Nicolas, MD  oxyCODONE-acetaminophen (ROXICET) 5-325 MG tablet Take one tab PO HS prn pain 05/28/17   Theone Murdoch  A, MD  zolpidem (AMBIEN) 5 MG tablet Take 5 mg by mouth at bedtime as needed for sleep.    [provider]    Family History Family History  Problem Relation Age of Onset  . Heart disease Mother        CABG    Social History Social History  Substance Use Topics  . Smoking status: Current Every Day Smoker    Packs/day: 0.50    Years: 18.00    Types: Cigarettes  . Smokeless tobacco: Current User     Comment: Vape  . Alcohol use No     Allergies   Zithromax [azithromycin] and Hydrocodone   Review of Systems Review of Systems  Constitutional: Positive for  fatigue. Negative for chills and fever.  Respiratory: Negative for cough and shortness of breath.   Cardiovascular: Negative for chest pain.  Gastrointestinal: Positive for abdominal pain, nausea and vomiting. Negative for anorexia, constipation, diarrhea, hematemesis, hematochezia and melena.  Genitourinary: Negative for dysuria, hematuria, vaginal bleeding and vaginal discharge.     Physical Exam Triage Vital Signs ED Triage Vitals  Enc Vitals Group     BP 05/28/17 1810 (!) 136/101     Pulse Rate 05/28/17 1810 77     Resp --      Temp 05/28/17 1810 99.2 F (37.3 C)     Temp Source 05/28/17 1810 Oral     SpO2 05/28/17 1810 100 %     Weight 05/28/17 1811 117 lb (53.1 kg)     Height 05/28/17 1811 5\' 5"  (1.651 m)     Head Circumference --      Peak Flow --      Pain Score 05/28/17 1811 5     Pain Loc --      Pain Edu? --      Excl. in [place]? --    No data found.   Updated Vital Signs BP (!) 136/101 (BP Location: Left Arm)   Pulse 77   Temp 99.2 F (37.3 C) (Oral)   Ht 5\' 5"  (1.651 m)   Wt 117 lb (53.1 kg)   LMP 03/28/2017 (Approximate)   SpO2 100%   BMI 19.47 kg/m   Visual Acuity Right Eye Distance:   Left Eye Distance:   Bilateral Distance:    Right Eye Near:   Left Eye Near:    Bilateral Near:     Physical Exam Nursing notes and Vital Signs reviewed. Appearance:  Patient appears stated age, and in no acute distress Eyes:  Pupils are equal, round, and reactive to light and accomodation.  Extraocular movement is intact.  Conjunctivae are not inflamed  Ears:  Normal externally Nose:   Normal Pharynx:  Normal; moist mucous membranes  Neck:  Supple.  No adenopathy.  Lungs:  Clear to auscultation.  Breath sounds are equal.  Moving air well. Heart:  Regular rate and rhythm without murmurs, rubs, or gallops.  Abdomen:  Tenderness left lower quadrant and midline pelvic area. No hepatosplenomegaly.  No CVA or flank tenderness.  No rebound tenderness.  Bowel sounds  present. Extremities:  No edema.  Skin:  No rash present.   Genitourinary (chaperoned exam):  Vulva appears normal without lesions or erythema.  Vagina has normal mucosae without lesions and no discharge in the vaginal vault.  Cervix appears normal without lesions.  There is no discharge present in the cervical os.  Distinct cervical motion tenderness is present.  Uterus is small and tender with bimanual exam.  Left  adnexae distinctly tender without masses.  Right adnexae has minimal tenderness without masses.  UC Treatments / Results  Labs (all labs ordered are listed, but only abnormal results are displayed) Labs Reviewed  GC/CHLAMYDIA PROBE AMP  HCG, QUANTITATIVE, PREGNANCY  POCT CBC W AUTO DIFF ([place]):  WBC 10.5; LY 30.4; MO 5.7; GR 63.9; Hgb 15.1;  Platelets 286  POCT WET + KOH PREP:  Negative clue cells; epithelial cells present; negative trich; rare WBC; negative yeast    EKG  EKG Interpretation None       Radiology US Pelvic Complete W Transvaginal And Torsion R/o  Result Date: 05/27/2017 CLINICAL DATA:  Left pelvic pain for 3 days. Negative pregnancy test. Clinical suspicion for ovarian torsion. EXAM: TRANSABDOMINAL AND TRANSVAGINAL ULTRASOUND OF PELVIS DOPPLER ULTRASOUND OF OVARIES TECHNIQUE: Both transabdominal and transvaginal ultrasound examinations of the pelvis were performed. Transabdominal technique was performed for global imaging of the pelvis including uterus, ovaries, adnexal regions, and pelvic cul-de-sac. It was necessary to proceed with endovaginal exam following the transabdominal exam to visualize the endometrium and ovaries. Color and duplex Doppler ultrasound was utilized to evaluate blood flow to the ovaries. COMPARISON:  None. FINDINGS: Uterus Measurements: 8.8 x 4.0 x 4.9 cm. No fibroids or other mass visualized. Endometrium Thickness: 14 mm.  No focal abnormality visualized. Right ovary Measurements: 2.8 x 1.7 x 2.3 cm. Normal appearance/no  adnexal mass. Left ovary Measurements: 3.1 x 1.7 x 2.4 cm. Normal appearance/no adnexal mass. Pulsed Doppler evaluation of both ovaries demonstrates normal low-resistance arterial and venous waveforms. Other findings No abnormal free fluid. IMPRESSION: Normal appearance of uterus and ovaries. No pelvic mass or other significant abnormality identified. No Doppler signs of ovarian torsion. Electronically Signed   By: Earle Gell M.D.   On: 05/27/2017 11:37    Procedures Procedures (including critical care time)  Medications Ordered in UC Medications  cefTRIAXone (ROCEPHIN) injection 250 mg (not administered)     Initial Impression / Assessment and Plan / UC Course  I have reviewed the triage vital signs and the nursing notes.  Pertinent labs & imaging results that were available during my care of the patient were reviewed by me and considered in my medical decision making (see chart for details).    Normal WBC (10.5) today reassuring. GC/chlamydia pending.  Quant HCG pending at patient's request. Administer Rocephin 250mg  IM   Begin empiric doxycycline 100mg  BID. Rx for Percocet 5/325, one HS prn (#5, no ref). Controlled Substance Prescriptions I have consulted the [place] Controlled Substances Registry for this patient, and feel the risk/benefit ratio today is favorable for proceeding with this prescription for a controlled substance.  Followup with [facility] if not improved 4 days. If symptoms become significantly worse during the night or over the weekend, proceed to the local emergency room.     Final Clinical Impressions(s) / UC Diagnoses   Final diagnoses:  Pelvic pain in female    New Prescriptions New Prescriptions   DOXYCYCLINE (VIBRAMYCIN) 100 MG CAPSULE    Take 1 capsule (100 mg total) by mouth 2 (two) times daily. Take with food.   OXYCODONE-ACETAMINOPHEN (ROXICET) 5-325 MG TABLET    Take one tab PO HS prn pain         Kandra Nicolas, MD 05/31/17 [phone]

## 2017-05-28 NOTE — ED Notes (Signed)
Upon attempting blood draw, patient pulled arm back and needle came out of the vein. Blood ran down her arm, I quickly removed tourniquet and re palpated, asked the patient to please not move and had a successful draw. I did advise her she would have a bruise.

## 2017-05-28 NOTE — Discharge Instructions (Signed)
May take Ibuprofen 200mg, 4 tabs every 8 hours with food as needed for pain. 

## 2017-05-28 NOTE — ED Triage Notes (Signed)
LLQ pain, nausea and vomiting x 3-4 days.Negative UA, Upreg and Ultrasound yesterday

## 2017-05-29 ENCOUNTER — Telehealth: Payer: Self-pay | Admitting: Emergency Medicine

## 2017-05-29 LAB — C. TRACHOMATIS/N. GONORRHOEAE RNA
C. trachomatis RNA, TMA: NOT DETECTED
N. gonorrhoeae RNA, TMA: NOT DETECTED

## 2017-05-29 LAB — HCG, QUANTITATIVE, PREGNANCY: HCG, Total, QN: <2 m[IU]/mL

## 2017-05-29 NOTE — Telephone Encounter (Signed)
After patient left as I was discharging I saw that Dr Assunta Found had ordered Rocephin 250mg . I called but there was no answer, so I called again this morning, left a message. She called back and said she di want to Rocephin but wanted to come in tomorrow and see Dr Assunta Found, because she needs her note for the court to actually say that she was unable to attend court due to her illness. I advised her she could come today, walk in a get the injection and not be charged for a visit, skip the line, but if she wants to see Dr Assunta Found she would be charged for a doctor visit, she said that's fine because she wants to see him again. Received her labs, called her back, advised her that all were negative. She also left AMA on Wednesday and complained to me that she did not receive any paperwork or doctor's note for her visit when she came is last night. I then found out that she left from Imaging and that Alyse Low tried multiple times to reach her and she did not call back. Her lawyer and probation officer advised her to come back yesterday and get notes for the court showing proof that she had been here since she failed to appear. Dr Assunta Found did give her the note but she wants it to be more specific and Junie Panning was the provider who saw her on the day in question and the day she left AMA.

## 2017-06-03 ENCOUNTER — Telehealth (HOSPITAL_COMMUNITY): Payer: Self-pay | Admitting: Internal Medicine

## 2017-06-03 NOTE — Telephone Encounter (Signed)
Patient requested revision to letter of 05/28/2017.  I spoke with Dr Assunta Found by phone today, who agreed with adding the times patient was in department 05/27/2017 to the letter of 05/28/2017.  Jodi Mourning MD, Medical Director [facility]

## 2017-06-09 LAB — POCT CBC W AUTO DIFF (K'VILLE URGENT CARE)

## 2018-12-16 ENCOUNTER — Other Ambulatory Visit: Payer: Self-pay

## 2018-12-16 ENCOUNTER — Encounter (HOSPITAL_COMMUNITY): Payer: Self-pay | Admitting: Emergency Medicine

## 2018-12-16 ENCOUNTER — Emergency Department (HOSPITAL_COMMUNITY)
Admission: EM | Admit: 2018-12-16 | Discharge: 2018-12-16 | Disposition: A | Discharge status: Home / Self Care | Payer: Self-pay | Attending: Emergency Medicine | Admitting: Emergency Medicine

## 2018-12-16 DIAGNOSIS — F1210 Cannabis abuse, uncomplicated: Secondary | ICD-10-CM | POA: Insufficient documentation

## 2018-12-16 DIAGNOSIS — R197 Diarrhea, unspecified: Secondary | ICD-10-CM | POA: Insufficient documentation

## 2018-12-16 DIAGNOSIS — F17210 Nicotine dependence, cigarettes, uncomplicated: Secondary | ICD-10-CM | POA: Insufficient documentation

## 2018-12-16 DIAGNOSIS — R110 Nausea: Secondary | ICD-10-CM | POA: Insufficient documentation

## 2018-12-16 DIAGNOSIS — D179 Benign lipomatous neoplasm, unspecified: Secondary | ICD-10-CM | POA: Insufficient documentation

## 2018-12-16 LAB — CBC WITH DIFFERENTIAL/PLATELET
Abs Immature Granulocytes: 0.02 10*3/uL (ref 0.00–0.07)
Basophils Absolute: 0 10*3/uL (ref 0.0–0.1)
Basophils Relative: 0 %
Eosinophils Absolute: 0.1 10*3/uL (ref 0.0–0.5)
Eosinophils Relative: 2 %
HCT: 41.3 % (ref 36.0–46.0)
Hemoglobin: 13.2 g/dL (ref 12.0–15.0)
Immature Granulocytes: 0 %
Lymphocytes Relative: 19 %
Lymphs Abs: 1.8 10*3/uL (ref 0.7–4.0)
MCH: 30.1 pg (ref 26.0–34.0)
MCHC: 32 g/dL (ref 30.0–36.0)
MCV: 94.3 fL (ref 80.0–100.0)
Monocytes Absolute: 0.8 10*3/uL (ref 0.1–1.0)
Monocytes Relative: 8 %
Neutro Abs: 6.7 10*3/uL (ref 1.7–7.7)
Neutrophils Relative %: 71 %
Platelets: 259 10*3/uL (ref 150–400)
RBC: 4.38 MIL/uL (ref 3.87–5.11)
RDW: 13 % (ref 11.5–15.5)
WBC: 9.5 10*3/uL (ref 4.0–10.5)
nRBC: 0 % (ref 0.0–0.2)

## 2018-12-16 LAB — COMPREHENSIVE METABOLIC PANEL
ALT: 24 U/L (ref 0–44)
AST: 44 U/L — ABNORMAL HIGH (ref 15–41)
Albumin: 3.6 g/dL (ref 3.5–5.0)
Alkaline Phosphatase: 58 U/L (ref 38–126)
Anion gap: 9 (ref 5–15)
BUN: 7 mg/dL (ref 6–20)
CO2: 25 mmol/L (ref 22–32)
Calcium: 9.4 mg/dL (ref 8.9–10.3)
Chloride: 106 mmol/L (ref 98–111)
Creatinine, Ser: 0.74 mg/dL (ref 0.44–1.00)
GFR calc Af Amer: >60 mL/min (ref >60)
GFR calc non Af Amer: >60 mL/min (ref >60)
Glucose, Bld: 106 mg/dL — ABNORMAL HIGH (ref 70–99)
Potassium: 3.7 mmol/L (ref 3.5–5.1)
Sodium: 140 mmol/L (ref 135–145)
Total Bilirubin: 0.6 mg/dL (ref 0.3–1.2)
Total Protein: 6.3 g/dL — ABNORMAL LOW (ref 6.5–8.1)

## 2018-12-16 LAB — I-STAT BETA HCG BLOOD, ED (MC, WL, AP ONLY): I-stat hCG, quantitative: <5 m[IU]/mL (ref <5)

## 2018-12-16 MED ORDER — LOPERAMIDE HCL 2 MG PO CAPS: 2.0000 mg | ORAL_CAPSULE | Freq: Four times a day (QID) | ORAL | 0 refills | Status: DC | PRN

## 2018-12-16 MED ORDER — ONDANSETRON 4 MG PO TBDP
4.0000 mg | ORAL_TABLET | Freq: Once | ORAL | Status: AC
  Administered 2018-12-16: 4 mg via ORAL
  Filled 2018-12-16: qty 1

## 2018-12-16 MED ORDER — ONDANSETRON HCL 4 MG PO TABS: 4.0000 mg | ORAL_TABLET | Freq: Four times a day (QID) | ORAL | 0 refills | Status: DC

## 2018-12-16 NOTE — ED Provider Notes (Signed)
[facility] EMERGENCY DEPARTMENT Provider Note   CSN: [id number] Arrival date & time: 12/16/18  1028    History   Chief Complaint Chief Complaint  Patient presents with  . Back Pain  . Diarrhea    HPI Lindsey Martin is a 38 y.o. female.     HPI   38 year old female presents today with several complaints.  Patient notes a 69-month history of a growth to her left upper back.  She notes this is slightly uncomfortable.  She has not been seen for this.  She denies any fever or weight loss.  She notes 3 days ago she developed nonbloody diarrhea, noting approximately 6 episodes per day, she denies any associate abdominal pain, notes some nausea denies any vomiting.  She denies any fever.  She denies any abnormal food or drinks but notes that she recently was released from prison about a week ago and has had change in diet.  No close sick contacts.  She denies any urinary complaints.  Past Medical History:  Diagnosis Date  . Anxiety   . Psoriasis    elbows, knees, left ankle  . Psoriasis   . Sensitive skin   . Teeth grinding   . TFCC (triangular fibrocartilage complex) tear 06/2014   right  . TMJ locking    right jaw    There are no active problems to display for this patient.   Past Surgical History:  Procedure Laterality Date  . CYST EXCISION Right 11/19/2000   labium majus  . FOREARM FRACTURE SURGERY    . HARDWARE REMOVAL Right 04/16/2015   Procedure: REMOVAL OF RIGHT ULNA PLATE;  Surgeon: Milly Jakob, MD;  Location: [facility];  Service: Orthopedics;  Laterality: Right;  . ULNA OSTEOTOMY Right 06/23/2014   Procedure: ULNAR SHORTENING;  Surgeon: Jolyn Nap, MD;  Location: [facility];  Service: Orthopedics;  Laterality: Right;  . WISDOM TOOTH EXTRACTION    . WRIST ARTHROSCOPY Right 06/23/2014   Procedure: ARTHROSCOPY WRIST AND ULNA SHORTENING OSTEOTOMY;  Surgeon: Jolyn Nap, MD;  Location: [facility];  Service: Orthopedics;  Laterality: Right;     OB History    Gravida  2   Para  1   Term  1   Preterm      AB  1   Living  1     SAB      TAB      Ectopic  1   Multiple      Live Births               Home Medications    Prior to Admission medications   Medication Sig Start Date End Date Taking? Authorizing Provider  ALPRAZolam Duanne Moron) 0.25 MG tablet Take 0.25 mg by mouth at bedtime as needed for anxiety.    [provider]  doxycycline (VIBRAMYCIN) 100 MG capsule Take 1 capsule (100 mg total) by mouth 2 (two) times daily. Take with food. 05/28/17   Kandra Nicolas, MD  loperamide (IMODIUM) 2 MG capsule Take 1 capsule (2 mg total) by mouth 4 (four) times daily as needed for diarrhea or loose stools. 12/16/18   Raymonda Pell, Dellis Filbert, PA-C  ondansetron (ZOFRAN) 4 MG tablet Take 1 tablet (4 mg total) by mouth every 6 (six) hours. 12/16/18   Antuan Limes, Dellis Filbert, PA-C  oxyCODONE-acetaminophen (ROXICET) 5-325 MG tablet Take one tab PO HS prn pain 05/28/17   Kandra Nicolas, MD  zolpidem [facility]) 5  MG tablet Take 5 mg by mouth at bedtime as needed for sleep.    [provider]    Family History Family History  Problem Relation Age of Onset  . Heart disease Mother        CABG    Social History Social History   Tobacco Use  . Smoking status: Current Every Day Smoker    Packs/day: 0.50    Years: 18.00    Pack years: 9.00    Types: Cigarettes  . Smokeless tobacco: Current User  . Tobacco comment: Vape  Substance Use Topics  . Alcohol use: No  . Drug use: Yes    Types: Marijuana    Comment: last used 03/24/15     Allergies   Zithromax [azithromycin] and Hydrocodone   Review of Systems Review of Systems  All other systems reviewed and are negative.    Physical Exam Updated Vital Signs BP (!) 150/106   Pulse (!) 108   Temp 98.2 F (36.8 C) (Oral)   Resp 12   SpO2 99%   Physical Exam Vitals signs and nursing note reviewed.   Constitutional:      Appearance: She is well-developed.  HENT:     Head: Normocephalic and atraumatic.  Eyes:     General: No scleral icterus.       Right eye: No discharge.        Left eye: No discharge.     Conjunctiva/sclera: Conjunctivae normal.     Pupils: Pupils are equal, round, and reactive to light.  Neck:     Musculoskeletal: Normal range of motion.     Vascular: No JVD.     Trachea: No tracheal deviation.  Pulmonary:     Effort: Pulmonary effort is normal.     Breath sounds: No stridor.  Abdominal:     General: Bowel sounds are normal. There is no distension.     Palpations: There is no mass.     Tenderness: There is no abdominal tenderness. There is no guarding or rebound.     Hernia: No hernia is present.  Skin:    Comments: Soft mass noted within the soft tissue of the left back, no overlying redness or warmth no significant tenderness-this is mobile  Neurological:     Mental Status: She is alert and oriented to person, place, and time.     Coordination: Coordination normal.  Psychiatric:        Behavior: Behavior normal.        Thought Content: Thought content normal.        Judgment: Judgment normal.      ED Treatments / Results  Labs (all labs ordered are listed, but only abnormal results are displayed) Labs Reviewed  COMPREHENSIVE METABOLIC PANEL - Abnormal; Notable for the following components:      Result Value   Glucose, Bld 106 (*)    Total Protein 6.3 (*)    AST 44 (*)    All other components within normal limits  CBC WITH DIFFERENTIAL/PLATELET  I-STAT BETA HCG BLOOD, ED ([facility], [facility], [facility] ONLY)    EKG None  Radiology No results found.  Procedures Procedures (including critical care time)  Medications Ordered in ED Medications  ondansetron (ZOFRAN-ODT) disintegrating tablet 4 mg (4 mg Oral Given 12/16/18 1242)     Initial Impression / Assessment and Plan / ED Course  I have reviewed the triage vital signs and the nursing notes.   Pertinent labs & imaging results that were available during  my care of the patient were reviewed by me and considered in my medical decision making (see chart for details).        Labs: CBC, CMP  Imaging:  Consults:  Therapeutics: Zofran  Discharge Meds: Zofran, Imodium  Assessment/Plan: 38 year old female presents today with several complaints.  Patient does have a growth on her back.  Uncertain etiology at this time although I have high suspicion for lipoma.  Patient will follow-up as an outpatient with primary care for reassessment of this within the next week.  Patient having nausea diarrhea.  She has no fever or bloody diarrhea no acute abdominal pain.  Given the amount of episodes per day and duration she will have basic labs to assess for dehydration or electrolyte abnormality.  Patient given Zofran here which improved her symptoms she is tolerating p.o.  She be discharged with Imodium, Zofran, outpatient follow-up and strict return precautions.  She verbalized understanding and agreement to today's plan had no further questions or concerns.      Final Clinical Impressions(s) / ED Diagnoses   Final diagnoses:  Diarrhea, unspecified type  Lipoma, unspecified site    ED Discharge Orders         Ordered    loperamide (IMODIUM) 2 MG capsule  4 times daily PRN     12/16/18 1239    ondansetron (ZOFRAN) 4 MG tablet  Every 6 hours     12/16/18 1239           Okey Regal, PA-C 12/16/18 1324    Davonna Belling, MD 12/16/18 1531

## 2018-12-16 NOTE — Discharge Instructions (Addendum)
Please read attached information. If you experience any new or worsening signs or symptoms please return to the emergency room for evaluation. Please follow-up with your primary care provider or specialist as discussed for repeat evaluation of the growth on your back. Please use medication prescribed only as directed and discontinue taking if you have any concerning signs or symptoms.

## 2018-12-16 NOTE — ED Triage Notes (Signed)
Patient reports a "knot" on the L side of her back x2 months that has gotten larger in size. States that the area feels "feverish" at times. She also reports diarrhea x3 days as well and temps of 100 at home. Pt a/ox4, resp e/u, nad. Denies cough, sob, sore throat. Was just released from prison on 5/7, states there were some inmates that were sick but denies exposure to them.

## 2018-12-16 NOTE — ED Notes (Signed)
Patient verbalizes understanding of discharge instructions. Opportunity for questioning and answers were provided. Armband removed by staff, pt discharged from ED. Ambulated out to lobby  

## 2019-02-09 ENCOUNTER — Encounter (HOSPITAL_COMMUNITY): Payer: Self-pay | Admitting: Emergency Medicine

## 2019-02-09 ENCOUNTER — Ambulatory Visit (HOSPITAL_COMMUNITY)
Admission: EM | Admit: 2019-02-09 | Discharge: 2019-02-09 | Disposition: A | Discharge status: Home / Self Care | Payer: Self-pay

## 2019-02-09 DIAGNOSIS — J029 Acute pharyngitis, unspecified: Secondary | ICD-10-CM

## 2019-02-09 DIAGNOSIS — H6690 Otitis media, unspecified, unspecified ear: Secondary | ICD-10-CM

## 2019-02-09 LAB — POCT RAPID STREP A: Streptococcus, Group A Screen (Direct): NEGATIVE

## 2019-02-09 MED ORDER — AMOXICILLIN 500 MG PO CAPS: 500.0000 mg | ORAL_CAPSULE | Freq: Three times a day (TID) | ORAL | 0 refills | Status: AC

## 2019-02-09 NOTE — Discharge Instructions (Addendum)
Take amoxicillin as prescribed for 10 days.  Take Tylenol or Motrin as needed for discomfort.  Return here or follow-up with your primary care provider you develop fever, chills, cough, nausea, vomiting, diarrhea, rash.   Go to the emergency department if you develop difficulty swallowing or breathing.

## 2019-02-09 NOTE — ED Triage Notes (Signed)
Pt c/o blisters in the back of her throat, sore throat, L ear pain x3 days.

## 2019-02-09 NOTE — ED Provider Notes (Signed)
[facility]    CSN: [id number] Arrival date & time: 02/09/19  0945     History   Chief Complaint Chief Complaint  Patient presents with  . Sore Throat  . Otalgia    HPI Lindsey Martin is a 38 y.o. female.   Patient presents today with 3-day history of sore throat with "blisters" on her throat, left otalgia.  She denies difficulty swallowing or breathing, fever, chills, cough, shortness of breath, chest pain, nausea, vomiting, diarrhea, rash.  LMP: 01/24/2019.   The history is provided by the patient.    Past Medical History:  Diagnosis Date  . Anxiety   . Psoriasis    elbows, knees, left ankle  . Psoriasis   . Sensitive skin   . Teeth grinding   . TFCC (triangular fibrocartilage complex) tear 06/2014   right  . TMJ locking    right jaw    There are no active problems to display for this patient.   Past Surgical History:  Procedure Laterality Date  . CYST EXCISION Right 11/19/2000   labium majus  . FOREARM FRACTURE SURGERY    . HARDWARE REMOVAL Right 04/16/2015   Procedure: REMOVAL OF RIGHT ULNA PLATE;  Surgeon: Milly Jakob, MD;  Location: [facility];  Service: Orthopedics;  Laterality: Right;  . ULNA OSTEOTOMY Right 06/23/2014   Procedure: ULNAR SHORTENING;  Surgeon: Jolyn Nap, MD;  Location: [facility];  Service: Orthopedics;  Laterality: Right;  . WISDOM TOOTH EXTRACTION    . WRIST ARTHROSCOPY Right 06/23/2014   Procedure: ARTHROSCOPY WRIST AND ULNA SHORTENING OSTEOTOMY;  Surgeon: Jolyn Nap, MD;  Location: [facility];  Service: Orthopedics;  Laterality: Right;    OB History    Gravida  2   Para  1   Term  1   Preterm      AB  1   Living  1     SAB      TAB      Ectopic  1   Multiple      Live Births               Home Medications    Prior to Admission medications   Medication Sig Start Date End Date Taking? Authorizing Provider  methadone (DOLOPHINE) 10 MG  tablet Take 45 mg by mouth every 8 (eight) hours.   Yes [provider]  venlafaxine (EFFEXOR) 75 MG tablet Take 75 mg by mouth 2 (two) times daily.   Yes [provider]  ALPRAZolam (XANAX) 0.25 MG tablet Take 0.25 mg by mouth at bedtime as needed for anxiety.    [provider]  amoxicillin (AMOXIL) 500 MG capsule Take 1 capsule (500 mg total) by mouth 3 (three) times daily for 10 days. 02/09/19 02/19/19  Sharion Balloon, NP  zolpidem (AMBIEN) 5 MG tablet Take 5 mg by mouth at bedtime as needed for sleep.    [provider]    Family History Family History  Problem Relation Age of Onset  . Heart disease Mother        CABG    Social History Social History   Tobacco Use  . Smoking status: Current Every Day Smoker    Packs/day: 0.50    Years: 18.00    Pack years: 9.00    Types: Cigarettes  . Smokeless tobacco: Current User  . Tobacco comment: Vape  Substance Use Topics  . Alcohol use: No  . Drug  use: Yes    Types: Marijuana    Comment: last used 03/24/15     Allergies   Zithromax [azithromycin] and Hydrocodone   Review of Systems Review of Systems  Constitutional: Negative for chills.  HENT: Negative for sinus pressure and trouble swallowing.   Eyes: Negative for pain and visual disturbance.  Respiratory: Negative for shortness of breath.   Cardiovascular: Negative for chest pain and palpitations.  Genitourinary: Negative for dysuria and hematuria.  Musculoskeletal: Negative for arthralgias and back pain.  Skin: Negative for color change.  Neurological: Negative for seizures and syncope.  All other systems reviewed and are negative.    Physical Exam Triage Vital Signs ED Triage Vitals [02/09/19 1008]  Enc Vitals Group     BP (!) 123/92     Pulse Rate 87     Resp 18     Temp (!) 97.3 F (36.3 C)     Temp src      SpO2 100 %     Weight      Height      Head Circumference      Peak Flow      Pain Score 6     Pain Loc       Pain Edu?      Excl. in [place]?    No data found.  Updated Vital Signs BP (!) 123/92   Pulse 87   Temp (!) 97.3 F (36.3 C)   Resp 18   LMP 01/30/2019   SpO2 100%   Visual Acuity Right Eye Distance:   Left Eye Distance:   Bilateral Distance:    Right Eye Near:   Left Eye Near:    Bilateral Near:     Physical Exam Vitals signs and nursing note reviewed.  Constitutional:      General: She is not in acute distress.    Appearance: She is well-developed.  HENT:     Head: Normocephalic and atraumatic.     Right Ear: Tympanic membrane and ear canal normal.     Left Ear: Ear canal normal. Tympanic membrane is erythematous.     Nose: No congestion or rhinorrhea.     Mouth/Throat:     Mouth: Mucous membranes are moist.     Pharynx: Uvula midline. Posterior oropharyngeal erythema present. No oropharyngeal exudate or uvula swelling.     Comments: Few blister-like lesions on palate and posterior pharynx Eyes:     Conjunctiva/sclera: Conjunctivae normal.  Neck:     Musculoskeletal: Neck supple.  Cardiovascular:     Rate and Rhythm: Normal rate and regular rhythm.  Pulmonary:     Effort: Pulmonary effort is normal. No respiratory distress.     Breath sounds: Normal breath sounds.  Abdominal:     Palpations: Abdomen is soft.     Tenderness: There is no abdominal tenderness.  Lymphadenopathy:     Cervical: Cervical adenopathy present.  Skin:    General: Skin is warm and dry.     Findings: No rash.  Neurological:     Mental Status: She is alert.      UC Treatments / Results  Labs (all labs ordered are listed, but only abnormal results are displayed) Labs Reviewed  CULTURE, GROUP A STREP [facility])  POCT RAPID STREP A    EKG   Radiology No results found.  Procedures Procedures (including critical care time)  Medications Ordered in UC Medications - No data to display  Initial Impression / Assessment and Plan / UC Course  I have reviewed the triage vital signs and  the nursing notes.  Pertinent labs & imaging results that were available during my care of the patient were reviewed by me and considered in my medical decision making (see chart for details).   Left otitis media and acute pharyngitis.  Treating today with amoxicillin.  Discussed with patient that she can take Tylenol or Motrin as needed for the pain.  Discussed that she should return here or follow-up with primary care provider if she develops fever, chills, cough, nausea, vomiting, diarrhea, rash.  Discussed that she should go to the emergency department if she develops difficulty swallowing or breathing.   Final Clinical Impressions(s) / UC Diagnoses   Final diagnoses:  Acute otitis media, unspecified otitis media type  Acute pharyngitis, unspecified etiology     Discharge Instructions     Take amoxicillin as prescribed for 10 days.  Take Tylenol or Motrin as needed for discomfort.  Return here or follow-up with your primary care provider you develop fever, chills, cough, nausea, vomiting, diarrhea, rash.   Go to the emergency department if you develop difficulty swallowing or breathing.      ED Prescriptions    Medication Sig Dispense Auth. Provider   amoxicillin (AMOXIL) 500 MG capsule Take 1 capsule (500 mg total) by mouth 3 (three) times daily for 10 days. 30 capsule Sharion Balloon, NP     Controlled Substance Prescriptions [place] Controlled Substance Registry consulted? Not Applicable   Sharion Balloon, NP 02/09/19 [phone]

## 2019-02-11 LAB — CULTURE, GROUP A STREP (THRC)

## 2020-10-30 ENCOUNTER — Other Ambulatory Visit: Payer: Self-pay

## 2020-10-30 ENCOUNTER — Encounter (HOSPITAL_COMMUNITY): Payer: Self-pay | Admitting: *Deleted

## 2020-10-30 ENCOUNTER — Ambulatory Visit (HOSPITAL_COMMUNITY)
Admission: EM | Admit: 2020-10-30 | Discharge: 2020-10-30 | Disposition: A | Discharge status: Home / Self Care | Payer: Self-pay | Attending: Urgent Care | Admitting: Urgent Care

## 2020-10-30 DIAGNOSIS — H1013 Acute atopic conjunctivitis, bilateral: Secondary | ICD-10-CM

## 2020-10-30 DIAGNOSIS — K1370 Unspecified lesions of oral mucosa: Secondary | ICD-10-CM

## 2020-10-30 DIAGNOSIS — K121 Other forms of stomatitis: Secondary | ICD-10-CM

## 2020-10-30 LAB — POCT RAPID STREP A, ED / UC: Streptococcus, Group A Screen (Direct): NEGATIVE

## 2020-10-30 MED ORDER — VALACYCLOVIR HCL 1 G PO TABS: 1000.0000 mg | ORAL_TABLET | Freq: Three times a day (TID) | ORAL | 0 refills | Status: AC

## 2020-10-30 MED ORDER — AZELASTINE HCL 0.05 % OP SOLN: 1.0000 [drp] | Freq: Two times a day (BID) | OPHTHALMIC | 12 refills | Status: AC

## 2020-10-30 MED ORDER — NAPROXEN 500 MG PO TABS: 500.0000 mg | ORAL_TABLET | Freq: Two times a day (BID) | ORAL | 0 refills | Status: DC

## 2020-10-30 NOTE — ED Provider Notes (Signed)
[facility]   MRN: [id number] DOB: 1980/11/26  Subjective:   Lindsey Martin is a 40 y.o. female presenting for several day history of acute onset blisters and sores on the roof of her mouth, outer upper right side of her lip.  Patient has also had very itchy eyes, severe throat pain.  Wants to make sure she does not have strep but also would like to be checked for herpes simplex virus.  Patient was just released from prison.  Denies being sexually active.  No cough, chest pain, shortness of breath.  No current facility-administered medications for this encounter.  Current Outpatient Medications:  .  ALPRAZolam (XANAX) 0.25 MG tablet, Take 0.25 mg by mouth at bedtime as needed for anxiety., Disp: , Rfl:  .  methadone (DOLOPHINE) 10 MG tablet, Take 45 mg by mouth every 8 (eight) hours., Disp: , Rfl:  .  venlafaxine (EFFEXOR) 75 MG tablet, Take 75 mg by mouth 2 (two) times daily., Disp: , Rfl:  .  zolpidem (AMBIEN) 5 MG tablet, Take 5 mg by mouth at bedtime as needed for sleep., Disp: , Rfl:    Allergies  Allergen Reactions  . Zithromax [Azithromycin] Hives  . Hydrocodone     Past Medical History:  Diagnosis Date  . Anxiety   . Psoriasis    elbows, knees, left ankle  . Psoriasis   . Sensitive skin   . Teeth grinding   . TFCC (triangular fibrocartilage complex) tear 06/2014   right  . TMJ locking    right jaw     Past Surgical History:  Procedure Laterality Date  . CYST EXCISION Right 11/19/2000   labium majus  . FOREARM FRACTURE SURGERY    . HARDWARE REMOVAL Right 04/16/2015   Procedure: REMOVAL OF RIGHT ULNA PLATE;  Surgeon: Milly Jakob, MD;  Location: [facility];  Service: Orthopedics;  Laterality: Right;  . ULNA OSTEOTOMY Right 06/23/2014   Procedure: ULNAR SHORTENING;  Surgeon: Jolyn Nap, MD;  Location: [facility];  Service: Orthopedics;  Laterality: Right;  . WISDOM TOOTH EXTRACTION    . WRIST ARTHROSCOPY  Right 06/23/2014   Procedure: ARTHROSCOPY WRIST AND ULNA SHORTENING OSTEOTOMY;  Surgeon: Jolyn Nap, MD;  Location: [facility];  Service: Orthopedics;  Laterality: Right;    Family History  Problem Relation Age of Onset  . Heart disease Mother        CABG    Social History   Tobacco Use  . Smoking status: Current Every Day Smoker    Packs/day: 0.50    Years: 18.00    Pack years: 9.00    Types: Cigarettes  . Smokeless tobacco: Current User  . Tobacco comment: Vape  Vaping Use  . Vaping Use: Never used  Substance Use Topics  . Alcohol use: No  . Drug use: Yes    Types: Marijuana    Comment: last used 03/24/15    ROS   Objective:   Vitals: BP 127/88 (BP Location: Right Arm)   Pulse 94   Temp 98.4 F (36.9 C) (Oral)   Resp 18   LMP 10/12/2020   SpO2 97%   Physical Exam Constitutional:      General: She is not in acute distress.    Appearance: Normal appearance. She is well-developed. She is not ill-appearing, toxic-appearing or diaphoretic.  HENT:     Head: Normocephalic and atraumatic.     Nose: Nose normal.  Mouth/Throat:     Mouth: Mucous membranes are moist.     Pharynx: Oropharynx is clear.   Eyes:     General: No scleral icterus.       Right eye: No discharge.     Extraocular Movements: Extraocular movements intact.     Conjunctiva/sclera: Conjunctivae normal.     Pupils: Pupils are equal, round, and reactive to light.  Cardiovascular:     Rate and Rhythm: Normal rate.  Pulmonary:     Effort: Pulmonary effort is normal.  Skin:    General: Skin is warm and dry.  Neurological:     General: No focal deficit present.     Mental Status: She is alert and oriented to person, place, and time.  Psychiatric:        Mood and Affect: Mood normal.        Behavior: Behavior normal.     Results for orders placed or performed during the hospital encounter of 10/30/20 (from the past 24 hour(s))  POCT Rapid Strep A     Status: None    Collection Time: 10/30/20 12:33 PM  Result Value Ref Range   Streptococcus, Group A Screen (Direct) NEGATIVE NEGATIVE     Assessment and Plan :   PDMP not reviewed this encounter.  1. Oral lesion   2. Oral ulceration   3. Allergic conjunctivitis of both eyes     Patient has history of allergic rhinitis, recommended maintaining her Zyrtec, add Flonase and use azelastine eyedrops as needed.  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  Cultures are pending. Counseled patient on potential for adverse effects with medications prescribed/recommended today, ER and return-to-clinic precautions discussed, patient verbalized understanding.    Jaynee Eagles, PA-C 10/30/20 1320

## 2020-10-30 NOTE — Discharge Instructions (Addendum)
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 Start Valtrex 3 times daily for 10 days. We will let you know about your test results when they come back for the HSV testing and strep culture.

## 2020-10-30 NOTE — ED Triage Notes (Signed)
Called x 3 no answer in lobby

## 2020-10-30 NOTE — ED Triage Notes (Signed)
Pt reports blisters in mouth and mucous in eyes.

## 2020-10-31 LAB — HSV CULTURE AND TYPING

## 2020-11-02 LAB — CULTURE, GROUP A STREP (THRC)

## 2020-11-03 ENCOUNTER — Encounter (HOSPITAL_COMMUNITY): Payer: Self-pay

## 2020-11-03 ENCOUNTER — Other Ambulatory Visit: Payer: Self-pay

## 2020-11-03 ENCOUNTER — Ambulatory Visit (HOSPITAL_COMMUNITY)
Admission: EM | Admit: 2020-11-03 | Discharge: 2020-11-03 | Disposition: A | Discharge status: Home / Self Care | Payer: Self-pay | Attending: Medical Oncology | Admitting: Medical Oncology

## 2020-11-03 DIAGNOSIS — L03012 Cellulitis of left finger: Secondary | ICD-10-CM

## 2020-11-03 DIAGNOSIS — K1379 Other lesions of oral mucosa: Secondary | ICD-10-CM

## 2020-11-03 MED ORDER — LIDOCAINE VISCOUS HCL 2 % MT SOLN: 10.0000 mL | Freq: Four times a day (QID) | OROMUCOSAL | 0 refills | Status: DC | PRN

## 2020-11-03 MED ORDER — DOXYCYCLINE HYCLATE 100 MG PO CAPS: 100.0000 mg | ORAL_CAPSULE | Freq: Two times a day (BID) | ORAL | 0 refills | Status: DC

## 2020-11-03 NOTE — ED Provider Notes (Signed)
[facility]    CSN: [id number] Arrival date & time: 11/03/20  1723      History   Chief Complaint Chief Complaint  Patient presents with  . Mouth Lesions    HPI Lindsey Martin is a 40 y.o. female.   HPI   Mouth lesions: Patient states that she has had blisters and sores on the roof of her mouth, gums and external mouth along with her face.  For the past the past week.  She has been using naproxen and antiviral medication without success.  She denies any trouble breathing or swallowing she does state the lesions are painful.  No reported drug use but does have a history of drug use.  No fevers.  She also has some evidence of erythema of the right third fingernail.  This has been present for about a week.  No discharge the area is tender to palpation.  Past Medical History:  Diagnosis Date  . Anxiety   . Psoriasis    elbows, knees, left ankle  . Psoriasis   . Sensitive skin   . Teeth grinding   . TFCC (triangular fibrocartilage complex) tear 06/2014   right  . TMJ locking    right jaw    There are no problems to display for this patient.   Past Surgical History:  Procedure Laterality Date  . CYST EXCISION Right 11/19/2000   labium majus  . FOREARM FRACTURE SURGERY    . HARDWARE REMOVAL Right 04/16/2015   Procedure: REMOVAL OF RIGHT ULNA PLATE;  Surgeon: Milly Jakob, MD;  Location: [facility];  Service: Orthopedics;  Laterality: Right;  . ULNA OSTEOTOMY Right 06/23/2014   Procedure: ULNAR SHORTENING;  Surgeon: Jolyn Nap, MD;  Location: [facility];  Service: Orthopedics;  Laterality: Right;  . WISDOM TOOTH EXTRACTION    . WRIST ARTHROSCOPY Right 06/23/2014   Procedure: ARTHROSCOPY WRIST AND ULNA SHORTENING OSTEOTOMY;  Surgeon: Jolyn Nap, MD;  Location: [facility];  Service: Orthopedics;  Laterality: Right;    OB History    Gravida  2   Para  1   Term  1   Preterm      AB  1   Living   1     SAB      IAB      Ectopic  1   Multiple      Live Births               Home Medications    Prior to Admission medications   Medication Sig Start Date End Date Taking? Authorizing Provider  doxycycline (VIBRAMYCIN) 100 MG capsule Take 1 capsule (100 mg total) by mouth 2 (two) times daily. 11/03/20  Yes Tushar Enns M, PA-C  magic mouthwash (nystatin, lidocaine, diphenhydrAMINE, alum & mag hydroxide) suspension Swish and swallow 10 mLs 4 (four) times daily as needed for mouth pain. 11/03/20  Yes Glessie Eustice M, PA-C  ALPRAZolam Duanne Moron) 0.25 MG tablet Take 0.25 mg by mouth at bedtime as needed for anxiety.    [provider]  azelastine (OPTIVAR) 0.05 % ophthalmic solution Place 1 drop into both eyes 2 (two) times daily. 10/30/20   Jaynee Eagles, PA-C  methadone (DOLOPHINE) 10 MG tablet Take 45 mg by mouth every 8 (eight) hours.    [provider]  naproxen (NAPROSYN) 500 MG tablet Take 1 tablet (500 mg total) by mouth 2 (two) times daily with a meal. 10/30/20   Bess Harvest,  Freida Busman, PA-C  valACYclovir (VALTREX) 1000 MG tablet Take 1 tablet (1,000 mg total) by mouth 3 (three) times daily. 10/30/20   Jaynee Eagles, PA-C  venlafaxine (EFFEXOR) 75 MG tablet Take 75 mg by mouth 2 (two) times daily.    [provider]  zolpidem (AMBIEN) 5 MG tablet Take 5 mg by mouth at bedtime as needed for sleep.    [provider]    Family History Family History  Problem Relation Age of Onset  . Heart disease Mother        CABG    Social History Social History   Tobacco Use  . Smoking status: Current Every Day Smoker    Packs/day: 0.50    Years: 18.00    Pack years: 9.00    Types: Cigarettes  . Smokeless tobacco: Current User  . Tobacco comment: Vape  Vaping Use  . Vaping Use: Never used  Substance Use Topics  . Alcohol use: No  . Drug use: Yes    Types: Marijuana    Comment: last used 03/24/15     Allergies   Zithromax [azithromycin] and  Hydrocodone   Review of Systems Review of Systems  As stated above in HPI Physical Exam Triage Vital Signs ED Triage Vitals  Enc Vitals Group     BP 11/03/20 1744 139/83     Pulse Rate 11/03/20 1744 98     Resp 11/03/20 1744 18     Temp 11/03/20 1744 98.5 F (36.9 C)     Temp Source 11/03/20 1744 Oral     SpO2 11/03/20 1744 98 %     Weight --      Height --      Head Circumference --      Peak Flow --      Pain Score 11/03/20 1742 9     Pain Loc --      Pain Edu? --      Excl. in [place]? --    No data found.  Updated Vital Signs BP 139/83 (BP Location: Left Arm)   Pulse 98   Temp 98.5 F (36.9 C) (Oral)   Resp 18   LMP 10/12/2020   SpO2 98%   Physical Exam Vitals and nursing note reviewed.  Constitutional:      Comments: Patient appears extremely restless and fidgety during our encounter.  HENT:     Head: Normocephalic and atraumatic.     Right Ear: External ear normal.     Left Ear: External ear normal.     Nose: Nose normal.     Mouth/Throat:     Comments: Scattered nonerythematic superficial ulcers of the mouth throughout.  Airway is clear and visualized well. Poor dentition.  Cardiovascular:     Rate and Rhythm: Normal rate and regular rhythm.     Heart sounds: Normal heart sounds.  Pulmonary:     Effort: Pulmonary effort is normal.     Breath sounds: Normal breath sounds.  Musculoskeletal:        General: Normal range of motion.     Cervical back: Neck supple.  Skin:    General: Skin is warm.     Capillary Refill: Capillary refill takes less than 2 seconds.     Comments: There are superficial ulcerations these skin throughout.  There are also is erythema of the right middle fingernail without discharge.   Neurological:     Mental Status: She is oriented to person, place, and time.      UC  Treatments / Results  Labs (all labs ordered are listed, but only abnormal results are displayed) Labs Reviewed - No data to display  EKG   Radiology No  results found.  Procedures Procedures (including critical care time)  Medications Ordered in UC Medications - No data to display  Initial Impression / Assessment and Plan / UC Course  I have reviewed the triage vital signs and the nursing notes.  Pertinent labs & imaging results that were available during my care of the patient were reviewed by me and considered in my medical decision making (see chart for details).     New.  Treat her with Magic mouthwash.  I do suspect that these may be ulcerations secondary to potential methamphetamine use given the presentation and patient's history.  She has not had any medication changes and I do not see any other sign of an allergic reaction.  I am going to also start her on doxycycline to help with the beginning stages of paronychia.  Would recommend close follow-up with patient which she agreeable to Final Clinical Impressions(s) / UC Diagnoses   Final diagnoses:  Mouth sores  Paronychia of finger of left hand   Discharge Instructions   None    ED Prescriptions    Medication Sig Dispense Auth. Provider   magic mouthwash (nystatin, lidocaine, diphenhydrAMINE, alum & mag hydroxide) suspension Swish and swallow 10 mLs 4 (four) times daily as needed for mouth pain. 180 mL Aviona Martenson M, PA-C   doxycycline (VIBRAMYCIN) 100 MG capsule Take 1 capsule (100 mg total) by mouth 2 (two) times daily. 20 capsule Hughie Closs, [place]     PDMP not reviewed this encounter.   Hughie Closs, [place] 11/03/20 1826

## 2020-11-03 NOTE — ED Triage Notes (Signed)
Pt c/o a sores in her mouth that are spreading. She states she has blisters that are lining up on both sides of her throat. She states she is having a finger nail problem. Pt states she has been applying a topical anesthetic that is around 42 years old.

## 2020-12-04 ENCOUNTER — Ambulatory Visit (HOSPITAL_COMMUNITY): Payer: Self-pay | Admitting: Behavioral Health

## 2021-01-02 ENCOUNTER — Ambulatory Visit (HOSPITAL_COMMUNITY): Payer: Self-pay | Admitting: Behavioral Health

## 2021-07-19 ENCOUNTER — Encounter (HOSPITAL_COMMUNITY): Payer: Self-pay

## 2021-07-19 ENCOUNTER — Ambulatory Visit (INDEPENDENT_AMBULATORY_CARE_PROVIDER_SITE_OTHER): Payer: Self-pay

## 2021-07-19 ENCOUNTER — Other Ambulatory Visit: Payer: Self-pay

## 2021-07-19 ENCOUNTER — Ambulatory Visit (HOSPITAL_COMMUNITY)
Admission: EM | Admit: 2021-07-19 | Discharge: 2021-07-19 | Disposition: A | Discharge status: Home / Self Care | Payer: Self-pay | Attending: Internal Medicine | Admitting: Internal Medicine

## 2021-07-19 DIAGNOSIS — S6292XA Unspecified fracture of left wrist and hand, initial encounter for closed fracture: Secondary | ICD-10-CM

## 2021-07-19 DIAGNOSIS — W109XXA Fall (on) (from) unspecified stairs and steps, initial encounter: Secondary | ICD-10-CM

## 2021-07-19 DIAGNOSIS — M25532 Pain in left wrist: Secondary | ICD-10-CM

## 2021-07-19 DIAGNOSIS — S62102A Fracture of unspecified carpal bone, left wrist, initial encounter for closed fracture: Secondary | ICD-10-CM

## 2021-07-19 NOTE — ED Provider Notes (Addendum)
[facility]    CSN: [id number] Arrival date & time: 07/19/21  0850      History   Chief Complaint Chief Complaint  Patient presents with   Fall   Arm Injury    HPI Lindsey Martin is a 40 y.o. female.   Patient presents with left wrist pain and swelling after fall earlier this morning.  Endorses that she tripped going down a pair of stairs and landed palm down with wrist pain.  Limited range of motion.  Unable to rotate wrist.  Denies numbness, tingling, prior injury or trauma.  Past Medical History:  Diagnosis Date   Anxiety    Psoriasis    elbows, knees, left ankle   Psoriasis    Sensitive skin    Teeth grinding    TFCC (triangular fibrocartilage complex) tear 06/2014   right   TMJ locking    right jaw    There are no problems to display for this patient.   Past Surgical History:  Procedure Laterality Date   CYST EXCISION Right 11/19/2000   labium majus   FOREARM FRACTURE SURGERY     HARDWARE REMOVAL Right 04/16/2015   Procedure: REMOVAL OF RIGHT ULNA PLATE;  Surgeon: Milly Jakob, MD;  Location: [facility];  Service: Orthopedics;  Laterality: Right;   ULNA OSTEOTOMY Right 06/23/2014   Procedure: ULNAR SHORTENING;  Surgeon: Jolyn Nap, MD;  Location: [facility];  Service: Orthopedics;  Laterality: Right;   WISDOM TOOTH EXTRACTION     WRIST ARTHROSCOPY Right 06/23/2014   Procedure: ARTHROSCOPY WRIST AND ULNA SHORTENING OSTEOTOMY;  Surgeon: Jolyn Nap, MD;  Location: [facility];  Service: Orthopedics;  Laterality: Right;    OB History     Gravida  2   Para  1   Term  1   Preterm      AB  1   Living  1      SAB      IAB      Ectopic  1   Multiple      Live Births               Home Medications    Prior to Admission medications   Medication Sig Start Date End Date Taking? Authorizing Provider  ALPRAZolam Duanne Moron) 0.25 MG tablet Take 0.25 mg by mouth at bedtime  as needed for anxiety.    [provider]  azelastine (OPTIVAR) 0.05 % ophthalmic solution Place 1 drop into both eyes 2 (two) times daily. 10/30/20   Jaynee Eagles, PA-C  doxycycline (VIBRAMYCIN) 100 MG capsule Take 1 capsule (100 mg total) by mouth 2 (two) times daily. 11/03/20   Hughie Closs, PA-C  magic mouthwash (nystatin, lidocaine, diphenhydrAMINE, alum & mag hydroxide) suspension Swish and swallow 10 mLs 4 (four) times daily as needed for mouth pain. 11/03/20   Hughie Closs, PA-C  methadone (DOLOPHINE) 10 MG tablet Take 45 mg by mouth every 8 (eight) hours.    [provider]  naproxen (NAPROSYN) 500 MG tablet Take 1 tablet (500 mg total) by mouth 2 (two) times daily with a meal. 10/30/20   Jaynee Eagles, PA-C  valACYclovir (VALTREX) 1000 MG tablet Take 1 tablet (1,000 mg total) by mouth 3 (three) times daily. 10/30/20   Jaynee Eagles, PA-C  venlafaxine (EFFEXOR) 75 MG tablet Take 75 mg by mouth 2 (two) times daily.    [provider]  zolpidem (AMBIEN) 5 MG tablet Take  5 mg by mouth at bedtime as needed for sleep.    [provider]    Family History Family History  Problem Relation Age of Onset   Heart disease Mother        CABG    Social History Social History   Tobacco Use   Smoking status: Every Day    Packs/day: 0.50    Years: 18.00    Pack years: 9.00    Types: Cigarettes   Smokeless tobacco: Current   Tobacco comments:    Vape  Vaping Use   Vaping Use: Never used  Substance Use Topics   Alcohol use: No   Drug use: Yes    Types: Marijuana    Comment: last used 03/24/15     Allergies   Zithromax [azithromycin] and Hydrocodone   Review of Systems Review of Systems  Constitutional: Negative.   Respiratory: Negative.    Cardiovascular: Negative.   Skin: Negative.   Neurological: Negative.     Physical Exam Triage Vital Signs ED Triage Vitals  Enc Vitals Group     BP 07/19/21 0942 (!) 153/100     Pulse Rate 07/19/21  0941 (!) 105     Resp 07/19/21 0941 19     Temp 07/19/21 0941 98.1 F (36.7 C)     Temp Source 07/19/21 0941 Oral     SpO2 07/19/21 0941 98 %     Weight --      Height --      Head Circumference --      Peak Flow --      Pain Score 07/19/21 0940 10     Pain Loc --      Pain Edu? --      Excl. in [place]? --    No data found.  Updated Vital Signs BP (!) 153/100    Pulse (!) 105    Temp 98.1 F (36.7 C) (Oral)    Resp 19    LMP 06/22/2021 (Exact Date)    SpO2 98%   Visual Acuity Right Eye Distance:   Left Eye Distance:   Bilateral Distance:    Right Eye Near:   Left Eye Near:    Bilateral Near:     Physical Exam Constitutional:      Appearance: Normal appearance. She is normal weight.  HENT:     Head: Normocephalic.  Eyes:     Extraocular Movements: Extraocular movements intact.  Pulmonary:     Effort: Pulmonary effort is normal.  Musculoskeletal:     Comments: Tenderness and mild to moderate swelling radial aspect of the left wrist extending into the left forearm, unable to complete range of motion, sensation intact, 2+ radial pulse  Neurological:     Mental Status: She is alert and oriented to person, place, and time. Mental status is at baseline.  Psychiatric:        Mood and Affect: Mood normal.        Behavior: Behavior normal.     UC Treatments / Results  Labs (all labs ordered are listed, but only abnormal results are displayed) Labs Reviewed - No data to display  EKG   Radiology No results found.  Procedures Procedures (including critical care time)  Medications Ordered in UC Medications - No data to display  Initial Impression / Assessment and Plan / UC Course  I have reviewed the triage vital signs and the nursing notes.  Pertinent labs & imaging results that were available during my care of  the patient were reviewed by me and considered in my medical decision making (see chart for details).  Closed fracture of the left wrist, initial  encounter  1.  Radial gutter placed by orthopedic staff, neurovascularly intact prior to and after placement nonweightbearing precautions given 2.  Patient wanting to use over-the-counter medication for pain management, in agreement with plan of care 3.  Orthopedic follow-up in 1 to 2 weeks, walking referral given 4.  Work note given 4.  Urgent care follow-up as needed Final Clinical Impressions(s) / UC Diagnoses   Final diagnoses:  None   Discharge Instructions   None    ED Prescriptions   None    PDMP not reviewed this encounter.   Hans Eden, NP 07/19/21 1754    Hans Eden, NP 08/01/21 [phone]

## 2021-07-19 NOTE — Progress Notes (Signed)
Orthopedic Tech Progress Note Patient Details:  MAKYNLI STILLS 1980-08-15 [id number]  Ortho Devices Type of Ortho Device: Rad Gutter splint Ortho Device/Splint Location: left Ortho Device/Splint Interventions: Ordered, Application, Adjustment   Post Interventions Patient Tolerated: Well Instructions Provided: Care of device  Charline Bills Traylen Eckels 07/19/2021, 11:03 AM Applied radial gutter splint

## 2021-07-19 NOTE — Discharge Instructions (Addendum)
Your x-ray today showed a fracture ( break in bone) of left wrist  A splint has been applied to you left wrist.  This is used to protect your injury and prevent further damage. Leave in place until seen by orthopedic specialist. Do not put objects into splint to scratch skin. If numbness or tingling occurs after placement please return to Urgent Care for evaluation.   Follow up with orthopedic specialist in 1-2 weeks. Call practice to make appointment. Information listed below. You may go to any orthopedic provider you deem fit.  Please do not put weight on fracture.

## 2021-07-19 NOTE — ED Triage Notes (Signed)
Pt reports she fell this morning from a set of a stairs. States she hurt her L arm and is concerned it might be broken.

## 2021-12-30 ENCOUNTER — Encounter (HOSPITAL_COMMUNITY): Payer: Self-pay | Admitting: Emergency Medicine

## 2021-12-30 ENCOUNTER — Telehealth (HOSPITAL_COMMUNITY): Payer: Self-pay | Admitting: *Deleted

## 2021-12-30 ENCOUNTER — Ambulatory Visit (HOSPITAL_COMMUNITY)
Admission: EM | Admit: 2021-12-30 | Discharge: 2021-12-30 | Disposition: A | Discharge status: Home / Self Care | Payer: Self-pay | Attending: Physician Assistant | Admitting: Physician Assistant

## 2021-12-30 DIAGNOSIS — F419 Anxiety disorder, unspecified: Secondary | ICD-10-CM | POA: Insufficient documentation

## 2021-12-30 DIAGNOSIS — K120 Recurrent oral aphthae: Secondary | ICD-10-CM | POA: Insufficient documentation

## 2021-12-30 MED ORDER — HYDROXYZINE HCL 50 MG PO TABS: 50.0000 mg | ORAL_TABLET | Freq: Three times a day (TID) | ORAL | 0 refills | Status: AC | PRN

## 2021-12-30 MED ORDER — LIDOCAINE VISCOUS HCL 2 % MT SOLN
OROMUCOSAL | Status: AC
  Filled 2021-12-30: qty 15

## 2021-12-30 MED ORDER — NYSTATIN 100000 UNIT/ML MT SUSP: 5.0000 mL | Freq: Three times a day (TID) | OROMUCOSAL | 0 refills | Status: DC | PRN

## 2021-12-30 MED ORDER — HYDROXYZINE HCL 50 MG PO TABS
50.0000 mg | ORAL_TABLET | Freq: Three times a day (TID) | ORAL | 0 refills | Status: DC | PRN
Start: 2021-12-30 — End: 2021-12-30

## 2021-12-30 MED ORDER — NYSTATIN 100000 UNIT/ML MT SUSP: 5.0000 mL | Freq: Three times a day (TID) | OROMUCOSAL | 0 refills | Status: AC | PRN

## 2021-12-30 NOTE — ED Provider Notes (Signed)
[facility]    CSN: [id number] Arrival date & time: 12/30/21  1633      History   Chief Complaint Chief Complaint  Patient presents with   Mouth Lesions   Anxiety    HPI Lindsey Martin is a 41 y.o. female.   Pt complains of mouth sores that started several days ago.  She reports they are very painful and has difficulty eating due to pain.  She is concerned it may be herpes.  She has tried nothing for the sx.  She also complains of increased anxiety due to life stressors with her family.  She denies SI/HI.  She reports she feels very overwhelmed.  She was previously prescribed xanax for anxiety, but has none at this time.  She does not have a PCP.    Past Medical History:  Diagnosis Date   Anxiety    Psoriasis    elbows, knees, left ankle   Psoriasis    Sensitive skin    Teeth grinding    TFCC (triangular fibrocartilage complex) tear 06/2014   right   TMJ locking    right jaw    There are no problems to display for this patient.   Past Surgical History:  Procedure Laterality Date   CYST EXCISION Right 11/19/2000   labium majus   FOREARM FRACTURE SURGERY     HARDWARE REMOVAL Right 04/16/2015   Procedure: REMOVAL OF RIGHT ULNA PLATE;  Surgeon: Milly Jakob, MD;  Location: [facility];  Service: Orthopedics;  Laterality: Right;   ULNA OSTEOTOMY Right 06/23/2014   Procedure: ULNAR SHORTENING;  Surgeon: Jolyn Nap, MD;  Location: [facility];  Service: Orthopedics;  Laterality: Right;   WISDOM TOOTH EXTRACTION     WRIST ARTHROSCOPY Right 06/23/2014   Procedure: ARTHROSCOPY WRIST AND ULNA SHORTENING OSTEOTOMY;  Surgeon: Jolyn Nap, MD;  Location: [facility];  Service: Orthopedics;  Laterality: Right;    OB History     Gravida  2   Para  1   Term  1   Preterm      AB  1   Living  1      SAB      IAB      Ectopic  1   Multiple      Live Births               Home  Medications    Prior to Admission medications   Medication Sig Start Date End Date Taking? Authorizing Provider  hydrOXYzine (ATARAX) 50 MG tablet Take 1 tablet (50 mg total) by mouth every 8 (eight) hours as needed for up to 14 days for anxiety. 12/30/21 01/13/22 Yes Ward, Lenise Arena, PA-C  magic mouthwash (nystatin, lidocaine, diphenhydrAMINE, alum & mag hydroxide) suspension Swish and spit 5 mLs 3 (three) times daily as needed for mouth pain. 12/30/21  Yes Ward, Lenise Arena, PA-C  ALPRAZolam (XANAX) 0.25 MG tablet Take 0.25 mg by mouth at bedtime as needed for anxiety.    [provider]  azelastine (OPTIVAR) 0.05 % ophthalmic solution Place 1 drop into both eyes 2 (two) times daily. 10/30/20   Jaynee Eagles, PA-C  doxycycline (VIBRAMYCIN) 100 MG capsule Take 1 capsule (100 mg total) by mouth 2 (two) times daily. 11/03/20   Hughie Closs, PA-C  methadone (DOLOPHINE) 10 MG tablet Take 45 mg by mouth every 8 (eight) hours.    [provider]  naproxen (NAPROSYN) 500 MG tablet  Take 1 tablet (500 mg total) by mouth 2 (two) times daily with a meal. 10/30/20   Jaynee Eagles, PA-C  valACYclovir (VALTREX) 1000 MG tablet Take 1 tablet (1,000 mg total) by mouth 3 (three) times daily. 10/30/20   Jaynee Eagles, PA-C  venlafaxine (EFFEXOR) 75 MG tablet Take 75 mg by mouth 2 (two) times daily.    [provider]  zolpidem (AMBIEN) 5 MG tablet Take 5 mg by mouth at bedtime as needed for sleep.    [provider]    Family History Family History  Problem Relation Age of Onset   Heart disease Mother        CABG    Social History Social History   Tobacco Use   Smoking status: Every Day    Packs/day: 0.50    Years: 18.00    Pack years: 9.00    Types: Cigarettes   Smokeless tobacco: Current   Tobacco comments:    Vape  Vaping Use   Vaping Use: Never used  Substance Use Topics   Alcohol use: No   Drug use: Yes    Types: Marijuana    Comment: last used 03/24/15      Allergies   Zithromax [azithromycin] and Hydrocodone   Review of Systems Review of Systems  Constitutional:  Negative for chills and fever.  HENT:  Positive for mouth sores. Negative for ear pain and sore throat.   Eyes:  Negative for pain and visual disturbance.  Respiratory:  Negative for cough and shortness of breath.   Cardiovascular:  Negative for chest pain and palpitations.  Gastrointestinal:  Negative for abdominal pain and vomiting.  Genitourinary:  Negative for dysuria and hematuria.  Musculoskeletal:  Negative for arthralgias and back pain.  Skin:  Negative for color change and rash.  Neurological:  Negative for seizures and syncope.  Psychiatric/Behavioral:  The patient is nervous/anxious.   All other systems reviewed and are negative.   Physical Exam Triage Vital Signs ED Triage Vitals  Enc Vitals Group     BP 12/30/21 1644 (!) 152/92     Pulse Rate 12/30/21 1644 (!) 109     Resp 12/30/21 1644 18     Temp 12/30/21 1644 98.8 F (37.1 C)     Temp src --      SpO2 12/30/21 1644 96 %     Weight --      Height --      Head Circumference --      Peak Flow --      Pain Score 12/30/21 1643 9     Pain Loc --      Pain Edu? --      Excl. in [place]? --    No data found.  Updated Vital Signs BP (!) 152/92   Pulse (!) 109   Temp 98.8 F (37.1 C)   Resp 18   LMP 09/21/2021   SpO2 96%   Visual Acuity Right Eye Distance:   Left Eye Distance:   Bilateral Distance:    Right Eye Near:   Left Eye Near:    Bilateral Near:     Physical Exam Vitals and nursing note reviewed.  Constitutional:      General: She is not in acute distress.    Appearance: She is well-developed.  HENT:     Head: Normocephalic and atraumatic.     Mouth/Throat:     Comments: Flat aphthous ulcer to the roof of her mouth and to the bottom lip Eyes:  Conjunctiva/sclera: Conjunctivae normal.  Cardiovascular:     Rate and Rhythm: Normal rate and regular rhythm.     Heart  sounds: No murmur heard. Pulmonary:     Effort: Pulmonary effort is normal. No respiratory distress.     Breath sounds: Normal breath sounds.  Abdominal:     Palpations: Abdomen is soft.     Tenderness: There is no abdominal tenderness.  Musculoskeletal:        General: No swelling.     Cervical back: Neck supple.  Skin:    General: Skin is warm and dry.     Capillary Refill: Capillary refill takes less than 2 seconds.  Neurological:     Mental Status: She is alert.  Psychiatric:        Mood and Affect: Mood normal.     UC Treatments / Results  Labs (all labs ordered are listed, but only abnormal results are displayed) Labs Reviewed  HSV CULTURE AND TYPING    EKG   Radiology No results found.  Procedures Procedures (including critical care time)  Medications Ordered in UC Medications - No data to display  Initial Impression / Assessment and Plan / UC Course  I have reviewed the triage vital signs and the nursing notes.  Pertinent labs & imaging results that were available during my care of the patient were reviewed by me and considered in my medical decision making (see chart for details).     Aphthous ulcers, magic mouthwash prescribed.  Pt requests herpes test, pending.  Requested xanax, atarax given today.  Advised follow up with pcp for further management of anxiety.  Behavioral Health UC information given for acute behavioral health needs.  Return precautions discussed.  Final Clinical Impressions(s) / UC Diagnoses   Final diagnoses:  Aphthous ulcer of mouth  Anxiety     Discharge Instructions      Use magic mouth wash as needed until ulcers resolve Will call with test results Can take Atarax as needed for anxiety Please follow up with primary care physician for long term management of anxiety If you are in a crisis please go to the [facility]    ED Prescriptions     Medication Sig Dispense Auth. Provider   magic mouthwash  (nystatin, lidocaine, diphenhydrAMINE, alum & mag hydroxide) suspension Swish and spit 5 mLs 3 (three) times daily as needed for mouth pain. 180 mL Ward, Lenise Arena, PA-C   hydrOXYzine (ATARAX) 50 MG tablet Take 1 tablet (50 mg total) by mouth every 8 (eight) hours as needed for up to 14 days for anxiety. 42 tablet Ward, Lenise Arena, PA-C      PDMP not reviewed this encounter.   Ward, Lenise Arena, PA-C 12/30/21 1913

## 2021-12-30 NOTE — ED Triage Notes (Signed)
Pt is present today with mouth sores, vomiting, and hot flashes that she noticed it x2 days .  Pt states that she also been battling anxiety due to personal life issues.

## 2021-12-30 NOTE — Discharge Instructions (Signed)
Use magic mouth wash as needed until ulcers resolve Will call with test results Can take Atarax as needed for anxiety Please follow up with primary care physician for long term management of anxiety If you are in a crisis please go to the [facility]

## 2022-01-02 LAB — HSV CULTURE AND TYPING

## 2022-08-07 ENCOUNTER — Other Ambulatory Visit: Payer: Self-pay

## 2022-08-07 ENCOUNTER — Emergency Department (HOSPITAL_BASED_OUTPATIENT_CLINIC_OR_DEPARTMENT_OTHER)
Admission: EM | Admit: 2022-08-07 | Discharge: 2022-08-07 | Discharge status: Against Medical Advice | Payer: Self-pay | Attending: Emergency Medicine | Admitting: Emergency Medicine

## 2022-08-07 ENCOUNTER — Encounter (HOSPITAL_BASED_OUTPATIENT_CLINIC_OR_DEPARTMENT_OTHER): Payer: Self-pay | Admitting: Emergency Medicine

## 2022-08-07 DIAGNOSIS — M79602 Pain in left arm: Secondary | ICD-10-CM | POA: Insufficient documentation

## 2022-08-07 DIAGNOSIS — R45851 Suicidal ideations: Secondary | ICD-10-CM | POA: Insufficient documentation

## 2022-08-07 DIAGNOSIS — L409 Psoriasis, unspecified: Secondary | ICD-10-CM | POA: Insufficient documentation

## 2022-08-07 DIAGNOSIS — Z5321 Procedure and treatment not carried out due to patient leaving prior to being seen by health care provider: Secondary | ICD-10-CM | POA: Insufficient documentation

## 2022-08-07 NOTE — ED Notes (Addendum)
Pt notified that she would need to change into green scrubs for her safety. Pt refuses to do so. Pt states "I will not go to some coo coo house if you want me too."

## 2022-08-07 NOTE — ED Notes (Signed)
Pt left after been roomed , during assessment , pt denies suicidal ideation or attempts. Reports depression . Reported she is here just to get an xray of her arm because she believes it is fractured . Pt decides to elope and proceed with the treatment .
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
## 2022-08-24 ENCOUNTER — Emergency Department (HOSPITAL_COMMUNITY)
Admission: EM | Admit: 2022-08-24 | Discharge: 2022-08-25 | Discharge status: Against Medical Advice | Payer: Self-pay | Attending: Emergency Medicine | Admitting: Emergency Medicine

## 2022-08-24 ENCOUNTER — Other Ambulatory Visit: Payer: Self-pay

## 2022-08-24 DIAGNOSIS — K0889 Other specified disorders of teeth and supporting structures: Secondary | ICD-10-CM | POA: Insufficient documentation

## 2022-08-24 DIAGNOSIS — W19XXXA Unspecified fall, initial encounter: Secondary | ICD-10-CM | POA: Insufficient documentation

## 2022-08-24 DIAGNOSIS — S6992XA Unspecified injury of left wrist, hand and finger(s), initial encounter: Secondary | ICD-10-CM | POA: Insufficient documentation

## 2022-08-24 DIAGNOSIS — Z5321 Procedure and treatment not carried out due to patient leaving prior to being seen by health care provider: Secondary | ICD-10-CM | POA: Insufficient documentation

## 2022-08-24 NOTE — ED Triage Notes (Signed)
Presents for a mechanical fall from which she injured her L wrist and R hand trying to catch herself. Bruising and swelling noted.   Would also like to be seen for dental pain x3 days. Lower R molar. No trouble swallowing or sore throat.

## 2022-08-25 ENCOUNTER — Other Ambulatory Visit: Payer: Self-pay

## 2022-08-25 ENCOUNTER — Emergency Department (HOSPITAL_BASED_OUTPATIENT_CLINIC_OR_DEPARTMENT_OTHER)
Admission: EM | Admit: 2022-08-25 | Discharge: 2022-08-25 | Disposition: A | Discharge status: Home / Self Care | Payer: Self-pay | Attending: Emergency Medicine | Admitting: Emergency Medicine

## 2022-08-25 ENCOUNTER — Encounter (HOSPITAL_BASED_OUTPATIENT_CLINIC_OR_DEPARTMENT_OTHER): Payer: Self-pay

## 2022-08-25 ENCOUNTER — Emergency Department (HOSPITAL_BASED_OUTPATIENT_CLINIC_OR_DEPARTMENT_OTHER): Payer: Self-pay | Admitting: Radiology

## 2022-08-25 DIAGNOSIS — W01198A Fall on same level from slipping, tripping and stumbling with subsequent striking against other object, initial encounter: Secondary | ICD-10-CM | POA: Insufficient documentation

## 2022-08-25 DIAGNOSIS — W19XXXA Unspecified fall, initial encounter: Secondary | ICD-10-CM

## 2022-08-25 DIAGNOSIS — M25441 Effusion, right hand: Secondary | ICD-10-CM | POA: Insufficient documentation

## 2022-08-25 MED ORDER — IBUPROFEN 800 MG PO TABS
800.0000 mg | ORAL_TABLET | Freq: Once | ORAL | Status: AC
  Administered 2022-08-25: 800 mg via ORAL
  Filled 2022-08-25: qty 1

## 2022-08-25 NOTE — ED Notes (Signed)
Pt verbalized understanding of d/c instructions, meds, and followup care. Denies questions. VSS, no distress noted. Steady gait to exit with all belongings.  ?

## 2022-08-25 NOTE — ED Provider Notes (Signed)
[facility] Provider Note   CSN: [id number] Arrival date & time: 08/25/22  1341     History  Chief Complaint  Patient presents with   Lindsey Martin is a 42 y.o. female.   Fall     42 year old female presenting after [place] injury at home.  The patient states that she tripped on a carpet causing her to fall forward onto a wall jamming her hands and fingers.  She had pain to her left wrist with some discomfort on pronation and supination.  She also endorses pain in her lower right fifth finger along the dorsum of the MCP joint.  She endorses difficulty with range of motion of the right fifth digit.  She also endorses pain in the right wrist.  She denies any head injury, is not on anticoagulation.  Arrived to the emergency department GCS 15, ambulatory in no distress.  Home Medications Prior to Admission medications   Medication Sig Start Date End Date Taking? Authorizing Provider  azelastine (OPTIVAR) 0.05 % ophthalmic solution Place 1 drop into both eyes 2 (two) times daily. 10/30/20   Jaynee Eagles, PA-C  doxycycline (VIBRAMYCIN) 100 MG capsule Take 1 capsule (100 mg total) by mouth 2 (two) times daily. 11/03/20   Hughie Closs, PA-C  magic mouthwash (nystatin, lidocaine, diphenhydrAMINE, alum & mag hydroxide) suspension Swish and spit 5 mLs 3 (three) times daily as needed for mouth pain. 12/30/21   Ward, Lenise Arena, PA-C  methadone (DOLOPHINE) 10 MG tablet Take 45 mg by mouth every 8 (eight) hours.    [provider]  naproxen (NAPROSYN) 500 MG tablet Take 1 tablet (500 mg total) by mouth 2 (two) times daily with a meal. 10/30/20   Jaynee Eagles, PA-C  valACYclovir (VALTREX) 1000 MG tablet Take 1 tablet (1,000 mg total) by mouth 3 (three) times daily. 10/30/20   Jaynee Eagles, PA-C  venlafaxine (EFFEXOR) 75 MG tablet Take 75 mg by mouth 2 (two) times daily.    [provider]      Allergies    Zithromax  [azithromycin] and Hydrocodone    Review of Systems   Review of Systems  All other systems reviewed and are negative.   Physical Exam Updated Vital Signs BP 100/70 (BP Location: Right Arm)   Pulse 73   Temp 98 F (36.7 C) (Temporal)   Resp 18   Ht '5\' 6"'$  (1.676 m)   Wt 63 kg   LMP  (LMP Unknown)   SpO2 100%   BMI 22.42 kg/m  Physical Exam Vitals and nursing note reviewed.  Constitutional:      General: She is not in acute distress. HENT:     Head: Normocephalic and atraumatic.  Eyes:     Conjunctiva/sclera: Conjunctivae normal.     Pupils: Pupils are equal, round, and reactive to light.  Cardiovascular:     Rate and Rhythm: Normal rate and regular rhythm.  Pulmonary:     Effort: Pulmonary effort is normal. No respiratory distress.  Abdominal:     General: There is no distension.     Tenderness: There is no guarding.  Musculoskeletal:        General: No deformity or signs of injury.     Cervical back: Neck supple.     Comments: Negative anatomic snuffbox tenderness bilaterally.  Bilateral upper extremities neurovascularly intact with intact motor function along the median, ulnar, radial nerve distributions.  Swelling about the right fifth MCP  joint with decreased range of motion limited by pain.  Intact pronation and supination of the bilateral extremities.  Skin:    Findings: No lesion or rash.  Neurological:     General: No focal deficit present.     Mental Status: She is alert. Mental status is at baseline.     ED Results / Procedures / Treatments   Labs (all labs ordered are listed, but only abnormal results are displayed) Labs Reviewed - No data to display  EKG None  Radiology DG Hand Complete Right  Result Date: 08/25/2022 CLINICAL DATA:  Pain, fall EXAM: RIGHT HAND - COMPLETE 3 VIEW COMPARISON:  03/07/2017 FINDINGS: Old fracture of the ulnar styloid process. No acute fracture, dislocation or subluxation. No osteolytic or osteoblastic changes. Distal  interphalangeal joint space narrowing consistent with mild Degenerative changes. IMPRESSION: No acute osseous abnormalities. Electronically Signed   By: Sammie Bench M.D.   On: 08/25/2022 14:48   DG Wrist Complete Left  Result Date: 08/25/2022 CLINICAL DATA:  Bilateral wrist pain after fall yesterday. EXAM: LEFT WRIST - COMPLETE 3+ VIEW COMPARISON:  None Available. FINDINGS: There is no evidence of fracture or dislocation. There is no evidence of arthropathy or other focal bone abnormality. Soft tissues are unremarkable. IMPRESSION: Negative. Electronically Signed   By: Marijo Conception M.D.   On: 08/25/2022 14:46   DG Wrist Complete Right  Result Date: 08/25/2022 CLINICAL DATA:  Bilateral wrist pain after fall. EXAM: RIGHT WRIST - COMPLETE 3+ VIEW COMPARISON:  None Available. FINDINGS: There is no evidence of acute fracture or dislocation. There is no evidence of arthropathy. Old ulnar styloid fracture is noted. Soft tissues are unremarkable. IMPRESSION: No acute abnormality seen. Electronically Signed   By: Marijo Conception M.D.   On: 08/25/2022 14:45    Procedures Procedures    Medications Ordered in ED Medications  ibuprofen (ADVIL) tablet 800 mg (has no administration in time range)    ED Course/ Medical Decision Making/ A&P                             Medical Decision Making Amount and/or Complexity of Data Reviewed Radiology: ordered.  Risk Prescription drug management.     42 year old female presenting after [place] injury at home.  The patient states that she tripped on a carpet causing her to fall forward onto a wall jamming her hands and fingers.  She had pain to her left wrist with some discomfort on pronation and supination.  She also endorses pain in her lower right fifth finger along the dorsum of the MCP joint.  She endorses difficulty with range of motion of the right fifth digit.  She also endorses pain in the right wrist.  She denies any head injury, is not on  anticoagulation.  Arrived to the emergency department GCS 15, ambulatory in no distress.  On exam: MSK exam significant for negative anatomic snuffbox tenderness bilaterally.  Bilateral upper extremities neurovascularly intact with intact motor function along the median, ulnar, radial nerve distributions.  Swelling about the right fifth MCP joint with decreased range of motion limited by pain.  Intact pronation and supination of the bilateral extremities.  X-ray imaging was performed to include x-ray of the right hand and right wrist, x-ray of the left wrist, negative for acute fracture or dislocation.  Given the patient's swelling and pain of the right fifth digit, concern for possible extensor tendon injury.  Will place the  patient in buddy tape with the fourth digit and have the patient follow-up with sports medicine, advised rest, ice, elevation of the extremity and NSAIDs for pain control.  Stable for discharge.  Final Clinical Impression(s) / ED Diagnoses Final diagnoses:  Fall, initial encounter  Finger joint swelling, right    Rx / DC Orders ED Discharge Orders          Ordered    AMB referral to sports medicine        08/25/22 1712              Regan Lemming, MD 08/25/22 [id number]

## 2022-08-25 NOTE — Discharge Instructions (Addendum)
Your exam is concerning for possible extensor tendon injury.  Your x-ray imaging was negative for fracture.  You have no tenderness over the scaphoid on either hand.  Recommend rest, elevation of the extremity, ice for the next day or 2, NSAIDs for pain control.  We will buddy tape the fifth digit on the right with the fourth digit.

## 2022-08-25 NOTE — ED Notes (Addendum)
Pt seen leaving waiting area with family member. Pt overheard saying that they were not going to wait to be seen.

## 2022-08-25 NOTE — ED Triage Notes (Signed)
Patient here POV from Home.  Endorses Fall Yesterday PM where she tripped over a lift in the carpet causing her to fall forward onto a wall with her Hands. Pain to left Wrist and Pain/Swelling to Right hand.   No Head injury. No Anticoagulants.   NAD Noted during Triage. A&Ox4. GCS 15. Ambulatory.

## 2022-09-05 ENCOUNTER — Ambulatory Visit (HOSPITAL_COMMUNITY)
Admission: EM | Admit: 2022-09-05 | Discharge: 2022-09-05 | Disposition: A | Discharge status: Home / Self Care | Payer: Self-pay | Attending: Family Medicine | Admitting: Family Medicine

## 2022-09-05 ENCOUNTER — Encounter (HOSPITAL_COMMUNITY): Payer: Self-pay

## 2022-09-05 DIAGNOSIS — K047 Periapical abscess without sinus: Secondary | ICD-10-CM

## 2022-09-05 MED ORDER — KETOROLAC TROMETHAMINE 30 MG/ML IJ SOLN
30.0000 mg | Freq: Once | INTRAMUSCULAR | Status: AC
  Administered 2022-09-05: 30 mg via INTRAMUSCULAR

## 2022-09-05 MED ORDER — AMOXICILLIN-POT CLAVULANATE 875-125 MG PO TABS: 1.0000 | ORAL_TABLET | Freq: Two times a day (BID) | ORAL | 0 refills | Status: AC

## 2022-09-05 MED ORDER — NAPROXEN 500 MG PO TABS: 500.0000 mg | ORAL_TABLET | Freq: Two times a day (BID) | ORAL | 0 refills | Status: AC | PRN

## 2022-09-05 MED ORDER — KETOROLAC TROMETHAMINE 30 MG/ML IJ SOLN
INTRAMUSCULAR | Status: AC
  Filled 2022-09-05: qty 1

## 2022-09-05 NOTE — ED Provider Notes (Addendum)
[facility]    CSN: [id number] Arrival date & time: 09/05/22  6962      History   Chief Complaint Chief Complaint  Patient presents with   Dental Pain    HPI Lindsey Martin is a 42 y.o. female.    Dental Pain  For pain in her right lower jaw and throat and ear.  The tooth in the right lower dental ridge broke about a month ago, and now it has been hurting worse and worse.  No fever or chills.  She states she is taken ibuprofen and Tylenol without any benefit  Zithromax and hydrocodone causes hives.  She states she needs something like oxycodone or Percocet instead usually Past Medical History:  Diagnosis Date   Anxiety    Psoriasis    elbows, knees, left ankle   Psoriasis    Sensitive skin    Teeth grinding    TFCC (triangular fibrocartilage complex) tear 06/2014   right   TMJ locking    right jaw    There are no problems to display for this patient.   Past Surgical History:  Procedure Laterality Date   CYST EXCISION Right 11/19/2000   labium majus   FOREARM FRACTURE SURGERY     HARDWARE REMOVAL Right 04/16/2015   Procedure: REMOVAL OF RIGHT ULNA PLATE;  Surgeon: Milly Jakob, MD;  Location: [facility];  Service: Orthopedics;  Laterality: Right;   ULNA OSTEOTOMY Right 06/23/2014   Procedure: ULNAR SHORTENING;  Surgeon: Jolyn Nap, MD;  Location: [facility];  Service: Orthopedics;  Laterality: Right;   WISDOM TOOTH EXTRACTION     WRIST ARTHROSCOPY Right 06/23/2014   Procedure: ARTHROSCOPY WRIST AND ULNA SHORTENING OSTEOTOMY;  Surgeon: Jolyn Nap, MD;  Location: [facility];  Service: Orthopedics;  Laterality: Right;    OB History     Gravida  2   Para  1   Term  1   Preterm      AB  1   Living  1      SAB      IAB      Ectopic  1   Multiple      Live Births               Home Medications    Prior to Admission medications   Medication Sig Start Date End Date  Taking? Authorizing Provider  amoxicillin-clavulanate (AUGMENTIN) 875-125 MG tablet Take 1 tablet by mouth 2 (two) times daily for 7 days. 09/05/22 09/12/22 Yes Barrett Henle, MD  naproxen (NAPROSYN) 500 MG tablet Take 1 tablet (500 mg total) by mouth 2 (two) times daily as needed (pain). 09/05/22  Yes Barrett Henle, MD  azelastine (OPTIVAR) 0.05 % ophthalmic solution Place 1 drop into both eyes 2 (two) times daily. 10/30/20   Jaynee Eagles, PA-C  magic mouthwash (nystatin, lidocaine, diphenhydrAMINE, alum & mag hydroxide) suspension Swish and spit 5 mLs 3 (three) times daily as needed for mouth pain. 12/30/21   Ward, Lenise Arena, PA-C  methadone (DOLOPHINE) 10 MG tablet Take 45 mg by mouth every 8 (eight) hours.    [provider]  valACYclovir (VALTREX) 1000 MG tablet Take 1 tablet (1,000 mg total) by mouth 3 (three) times daily. 10/30/20   Jaynee Eagles, PA-C  venlafaxine (EFFEXOR) 75 MG tablet Take 75 mg by mouth 2 (two) times daily.    [provider]    Family History Family History  Problem  Relation Age of Onset   Heart disease Mother        CABG    Social History Social History   Tobacco Use   Smoking status: Every Day    Packs/day: 0.50    Years: 18.00    Total pack years: 9.00    Types: Cigarettes   Smokeless tobacco: Current   Tobacco comments:    Vape  Vaping Use   Vaping Use: Never used  Substance Use Topics   Alcohol use: No   Drug use: Yes    Types: Marijuana    Comment: last used 03/24/15     Allergies   Zithromax [azithromycin] and Hydrocodone   Review of Systems Review of Systems   Physical Exam Triage Vital Signs ED Triage Vitals [09/05/22 0836]  Enc Vitals Group     BP (!) 137/90     Pulse Rate 85     Resp 18     Temp 98.3 F (36.8 C)     Temp Source Oral     SpO2 100 %     Weight      Height      Head Circumference      Peak Flow      Pain Score      Pain Loc      Pain Edu?      Excl. in [place]?    No data  found.  Updated Vital Signs BP (!) 137/90 (BP Location: Left Arm)   Pulse 85   Temp 98.3 F (36.8 C) (Oral)   Resp 18   LMP  (LMP Unknown)   SpO2 100%   Visual Acuity Right Eye Distance:   Left Eye Distance:   Bilateral Distance:    Right Eye Near:   Left Eye Near:    Bilateral Near:     Physical Exam Vitals reviewed.  Constitutional:      General: She is not in acute distress.    Appearance: She is not ill-appearing, toxic-appearing or diaphoretic.  HENT:     Right Ear: Tympanic membrane normal.     Left Ear: Tympanic membrane normal.     Nose: Nose normal.     Mouth/Throat:     Mouth: Mucous membranes are moist.     Comments: There is mild erythema of the oropharynx  There are carious teeth and there is a broken tooth on the right lower dental ridge.  No swelling along the dental ridge Eyes:     Extraocular Movements: Extraocular movements intact.     Pupils: Pupils are equal, round, and reactive to light.  Cardiovascular:     Rate and Rhythm: Normal rate and regular rhythm.     Heart sounds: No murmur heard. Pulmonary:     Effort: Pulmonary effort is normal.     Breath sounds: Normal breath sounds.  Musculoskeletal:     Cervical back: Neck supple.  Lymphadenopathy:     Cervical: No cervical adenopathy.  Skin:    Coloration: Skin is not jaundiced or pale.  Neurological:     General: No focal deficit present.     Mental Status: She is alert and oriented to person, place, and time.  Psychiatric:        Behavior: Behavior normal.      UC Treatments / Results  Labs (all labs ordered are listed, but only abnormal results are displayed) Labs Reviewed - No data to display  EKG   Radiology No results found.  Procedures Procedures (including critical  care time)  Medications Ordered in UC Medications  ketorolac (TORADOL) 30 MG/ML injection 30 mg (has no administration in time range)    Initial Impression / Assessment and Plan / UC Course  I have  reviewed the triage vital signs and the nursing notes.  Pertinent labs & imaging results that were available during my care of the patient were reviewed by me and considered in my medical decision making (see chart for details).        Toradol was given for pain and naproxen is sent for pain relief.  Augmentin is sent for her antibiotic.  She is given contact information for low-cost dental providers. Final Clinical Impressions(s) / UC Diagnoses   Final diagnoses:  Dental infection     Discharge Instructions      You have been given a shot of Toradol 30 mg today.  Take amoxicillin-clavulanate 875 mg--1 tab twice daily with food for 7 days  Take naproxen 500 mg--1 tablet every 12 hours as needed for pain      ED Prescriptions     Medication Sig Dispense Auth. Provider   amoxicillin-clavulanate (AUGMENTIN) 875-125 MG tablet Take 1 tablet by mouth 2 (two) times daily for 7 days. 14 tablet Marqueze Ramcharan, Gwenlyn Perking, MD   naproxen (NAPROSYN) 500 MG tablet Take 1 tablet (500 mg total) by mouth 2 (two) times daily as needed (pain). 30 tablet Illiana Losurdo, Gwenlyn Perking, MD      I have reviewed the PDMP during this encounter.   Barrett Henle, MD 09/05/22 1761    Barrett Henle, MD 09/05/22 [id number]

## 2022-09-05 NOTE — Discharge Instructions (Addendum)
You have been given a shot of Toradol 30 mg today.  Take amoxicillin-clavulanate 875 mg--1 tab twice daily with food for 7 days  Take naproxen 500 mg--1 tablet every 12 hours as needed for pain

## 2022-09-05 NOTE — ED Triage Notes (Signed)
Here for dental pain  that started a couple days ago. Pt reports not able to eat due to pain.

## 2022-09-17 ENCOUNTER — Emergency Department (HOSPITAL_COMMUNITY)
Admission: EM | Admit: 2022-09-17 | Discharge: 2022-09-17 | Discharge status: Against Medical Advice | Payer: Self-pay | Attending: Student | Admitting: Student

## 2022-09-17 DIAGNOSIS — T7421XA Adult sexual abuse, confirmed, initial encounter: Secondary | ICD-10-CM | POA: Insufficient documentation

## 2022-09-17 DIAGNOSIS — Z5321 Procedure and treatment not carried out due to patient leaving prior to being seen by health care provider: Secondary | ICD-10-CM | POA: Insufficient documentation

## 2022-09-17 DIAGNOSIS — R102 Pelvic and perineal pain: Secondary | ICD-10-CM | POA: Insufficient documentation

## 2022-09-17 LAB — PREGNANCY, URINE: Preg Test, Ur: NEGATIVE
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
## 2022-09-17 NOTE — ED Notes (Signed)
Pt stated that she was leaving. Pt educated on risks of leaving before being seen and verbalized understanding of these risks.

## 2022-09-17 NOTE — SANE Note (Signed)
The SANE/[facility] Naval architect) consult has been completed. Sherrill Raring, [place] notified. Dr. Jeanell Sparrow also in attendance. Please contact the SANE/[facility] nurse on call (listed in [place]) with any further concerns.  Patient advised that she is outside of timeframe for evidence collection. Informed that provider would like to further evaluate.
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
